# Patient Record
Sex: Female | Born: 2000 | Race: Black or African American | Hispanic: No | Marital: Single | Smoking: Never smoker
Health system: Southern US, Community
[De-identification: ages and names within clinical notes are randomized; demographics above are authoritative.]

## PROBLEM LIST (undated history)

## (undated) DIAGNOSIS — D649 Anemia, unspecified: Secondary | ICD-10-CM

## (undated) HISTORY — PX: NO PAST SURGERIES: SHX2092

## (undated) HISTORY — PX: OTHER SURGICAL HISTORY: SHX169

---
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
## 2021-02-28 ENCOUNTER — Emergency Department
Admission: EM | Admit: 2021-02-28 | Discharge: 2021-02-28 | Disposition: A | Discharge status: Home / Self Care | Payer: Medicaid Other | Attending: Emergency Medicine | Admitting: Emergency Medicine

## 2021-02-28 ENCOUNTER — Other Ambulatory Visit: Payer: Self-pay

## 2021-02-28 DIAGNOSIS — N390 Urinary tract infection, site not specified: Secondary | ICD-10-CM | POA: Insufficient documentation

## 2021-02-28 DIAGNOSIS — M545 Low back pain, unspecified: Secondary | ICD-10-CM | POA: Diagnosis present

## 2021-02-28 DIAGNOSIS — Z8616 Personal history of COVID-19: Secondary | ICD-10-CM | POA: Diagnosis not present

## 2021-02-28 LAB — URINALYSIS, ROUTINE W REFLEX MICROSCOPIC
Bilirubin Urine: NEGATIVE
Glucose, UA: NEGATIVE mg/dL
Hgb urine dipstick: NEGATIVE
Ketones, ur: NEGATIVE mg/dL
Nitrite: NEGATIVE
Protein, ur: NEGATIVE mg/dL
Specific Gravity, Urine: 1.003 — ABNORMAL LOW (ref 1.005–1.030)
pH: 7 (ref 5.0–8.0)

## 2021-02-28 LAB — POC URINE PREG, ED: Preg Test, Ur: NEGATIVE

## 2021-02-28 MED ORDER — CEPHALEXIN 500 MG PO CAPS: 500.0000 mg | ORAL_CAPSULE | Freq: Three times a day (TID) | ORAL | 0 refills | Status: DC

## 2021-02-28 NOTE — Discharge Instructions (Signed)
Follow-up with your primary care provider if any continued problems.  You may use Tylenol or ibuprofen as needed for pain.  Increase fluids to stay hydrated as you have a urinary tract infection.  Take all of the antibiotics that was sent to your pharmacy.

## 2021-02-28 NOTE — ED Provider Notes (Signed)
[facility] Emergency Department Provider Note   ____________________________________________   Event Date/Time   First MD Initiated Contact with Patient 02/28/21 1021     (approximate)  I have reviewed the triage vital signs and the nursing notes.   HISTORY  Chief Complaint Back Pain    HPI Kristen Pittman is a 20 y.o. female presents to the ED with complaint of abdominal cramps and low back pain for the past week.  Patient has some nausea but denies any vomiting or diarrhea.  Patient has had irregular periods and states that her last period began on October 14 but was 8 weeks late when he did start.  Currently she is not taking any birth control.  Denies any fever or chills.  Rates her pain as 7 out of 10.         Past Medical History:  Diagnosis Date   Anemia    History of COVID-19 05/10/2019   Diagnosed 04/28/2019   History of depression 05/10/2019   UTI (urinary tract infection) 07/21/2020    Patient Active Problem List   Diagnosis Date Noted   Anxiety and depression 07/24/2020   History of depression 05/10/2019   History of COVID-19 05/10/2019    Past Surgical History:  Procedure Laterality Date   NO PAST SURGERIES     no surgical history      Prior to Admission medications   Medication Sig Start Date End Date Taking? Authorizing Provider  cephALEXin (KEFLEX) 500 MG capsule Take 1 capsule (500 mg total) by mouth 3 (three) times daily. 02/28/21  Yes Bridget Hartshorn L, PA-C  Norgestimate-Ethinyl Estradiol Triphasic (TRI-SPRINTEC) 0.18/0.215/0.25 MG-35 MCG tablet Take 1 tablet by mouth daily. 07/24/20   Lawhorn, Vanessa [place], CNM  ferrous sulfate 324 MG TBEC Take 324 mg by mouth. Patient not taking: No sig reported  03/24/20  [provider]  sertraline (ZOLOFT) 50 MG tablet Take 1 tablet (50 mg total) by mouth daily. Patient not taking: No sig reported 10/08/19 03/24/20  Gunnar Bulla, CNM     Allergies Lemon oil and Vaccinium angustifolium  Family History  Problem Relation Age of Onset   Migraines Mother    Seizures Mother    Schizophrenia Father    Diabetes Maternal Grandmother    Heart disease Maternal Grandmother    Hypertension Maternal Grandmother    Bone cancer Maternal Grandmother    Colon cancer Maternal Grandfather    Breast cancer Paternal Grandmother     Social History Social History   Tobacco Use   Smoking status: Never   Smokeless tobacco: Never  Vaping Use   Vaping Use: Never used  Substance Use Topics   Alcohol use: No   Drug use: No    Review of Systems Constitutional: No fever/chills Eyes: No visual changes. ENT: No sore throat. Cardiovascular: Denies chest pain. Respiratory: Denies shortness of breath. Gastrointestinal: Mild abdominal cramping.  Positive nausea, no vomiting.  No diarrhea.  No constipation. Genitourinary: Negative for dysuria. Musculoskeletal: Positive for low back pain. Skin: Negative for rash. Neurological: Negative for headaches, focal weakness or numbness. ____________________________________________   PHYSICAL EXAM:  VITAL SIGNS: ED Triage Vitals  Enc Vitals Group     BP 02/28/21 0944 116/67     Pulse Rate 02/28/21 0944 97     Resp 02/28/21 0944 18     Temp 02/28/21 0944 98.9 F (37.2 C)     Temp Source 02/28/21 0944 Oral     SpO2 02/28/21 0944 99 %  Weight 02/28/21 0945 162 lb (73.5 kg)     Height 02/28/21 0945 5\' 3"  (1.6 m)     Head Circumference --      Peak Flow --      Pain Score 02/28/21 0945 7     Pain Loc --      Pain Edu? --      Excl. in GC? --     Constitutional: Alert and oriented. Well appearing and in no acute distress. Eyes: Conjunctivae are normal.  Head: Atraumatic. Neck: No stridor.   Cardiovascular: Normal rate, regular rhythm. Grossly normal heart sounds.  Good peripheral circulation. Respiratory: Normal respiratory effort.  No retractions. Lungs  CTAB. Gastrointestinal: Soft and nontender. No distention.No CVA tenderness. Musculoskeletal: There is minimal tenderness on palpation of the lumbar spine.  No tenderness is noted on palpation of the thoracic or cervical spine.  Range of motion without restriction.  Good muscle strength bilaterally.  Normal gait noted. Neurologic:  Normal speech and language. No gross focal neurologic deficits are appreciated. No gait instability. Skin:  Skin is warm, dry and intact. No rash noted. Psychiatric: Mood and affect are normal. Speech and behavior are normal.  ____________________________________________   LABS (all labs ordered are listed, but only abnormal results are displayed)  Labs Reviewed  URINALYSIS, ROUTINE W REFLEX MICROSCOPIC - Abnormal; Notable for the following components:      Result Value   Color, Urine YELLOW (*)    APPearance CLOUDY (*)    Specific Gravity, Urine 1.003 (*)    Leukocytes,Ua MODERATE (*)    Bacteria, UA RARE (*)    All other components within normal limits  POC URINE PREG, ED   ____________________________________________   PROCEDURES  Procedure(s) performed (including Critical Care):  Procedures   ____________________________________________   INITIAL IMPRESSION / ASSESSMENT AND PLAN / ED COURSE  As part of my medical decision making, I reviewed the following data within the electronic MEDICAL RECORD NUMBER Notes from prior ED visits and [place] Controlled Substance Database  20 year old female presents to the ED with complaint of back pain without history of injury.  She also is concerned about possible pregnancy as her period was approximately 8 weeks late before starting her period.  Urinalysis is suspicious for a urinary tract infection and patient's pregnancy test was negative.  She was made aware of both.  A prescription for Keflex 500 mg 3 times daily for 10 days was sent to the pharmacy.  She is to follow-up with her PCP if any continued problems.  She  is encouraged to drink lots of fluids also.  ____________________________________________   FINAL CLINICAL IMPRESSION(S) / ED DIAGNOSES  Final diagnoses:  Urinary tract infection without hematuria, site unspecified     ED Discharge Orders          Ordered    cephALEXin (KEFLEX) 500 MG capsule  3 times daily        02/28/21 1224             Note:  This document was prepared using Dragon voice recognition software and may include unintentional dictation errors.    13/12/22, PA-C 02/28/21 1543    13/12/22, MD 03/01/21 [phone]

## 2021-02-28 NOTE — ED Triage Notes (Signed)
Pt states that she has been having back pain and pelvic cramps for the past week- pt does have some nausea, but denies vomiting and diarrhea- pt has irregular periods and states that her LMP was October 14th- denies urinary symptoms

## 2021-05-23 ENCOUNTER — Encounter: Payer: Self-pay | Admitting: Nurse Practitioner

## 2021-05-23 ENCOUNTER — Telehealth: Payer: Medicaid Other | Admitting: Nurse Practitioner

## 2021-05-23 DIAGNOSIS — N926 Irregular menstruation, unspecified: Secondary | ICD-10-CM | POA: Diagnosis not present

## 2021-05-23 NOTE — Progress Notes (Signed)
Virtual Visit Consent   Kristen Pittman, you are scheduled for a virtual visit with Kristen Daphine Deutscher, FNP, a [facility] provider, today.     Just as with appointments in the office, your consent must be obtained to participate.  Your consent will be active for this visit and any virtual visit you may have with one of our providers in the next 365 days.     If you have a MyChart account, a copy of this consent can be sent to you electronically.  All virtual visits are billed to your insurance company just like a traditional visit in the office.    As this is a virtual visit, video technology does not allow for your provider to perform a traditional examination.  This may limit your provider's ability to fully assess your condition.  If your provider identifies any concerns that need to be evaluated in person or the need to arrange testing (such as labs, EKG, etc.), we will make arrangements to do so.     Although advances in technology are sophisticated, we cannot ensure that it will always work on either your end or our end.  If the connection with a video visit is poor, the visit may have to be switched to a telephone visit.  With either a video or telephone visit, we are not always able to ensure that we have a secure connection.     I need to obtain your verbal consent now.   Are you willing to proceed with your visit today? YES   [name] has provided verbal consent on 05/23/2021 for a virtual visit (video or telephone).   Kristen Daphine Deutscher, FNP   Date: 05/23/2021 9:02 AM   Virtual Visit via Video Note   I, Kristen Brittnye Josephs, connected with Kristen Pittman ([id number], 09-11-2000) on 05/23/21 at  9:00 AM EST by a video-enabled telemedicine application and verified that I am speaking with the correct person using two identifiers.  Location: Patient: Virtual Visit Location Patient: Home Provider: Virtual Visit Location Provider: Mobile   I  discussed the limitations of evaluation and management by telemedicine and the availability of in person appointments. The patient expressed understanding and agreed to proceed.    History of Present Illness: Kristen Pittman is a 21 y.o. who identifies as a female who was assigned female at birth, and is being seen today for late menses.  HPI: Patient states she had a late period. Her last menses was 03/23/21. She has some nausea and vomiting yesterday. Along with a headache. She did a home pregnancy test and had "super light" positive line.    Review of Systems  Constitutional:  Negative for chills and fever.  HENT: Negative.    Respiratory: Negative.    Cardiovascular: Negative.   Genitourinary: Negative.   Skin: Negative.   Neurological: Negative.   Psychiatric/Behavioral: Negative.    All other systems reviewed and are negative.  Problems:  Patient Active Problem List   Diagnosis Date Noted   Anxiety and depression 07/24/2020   History of depression 05/10/2019   History of COVID-19 05/10/2019    Allergies:  Allergies  Allergen Reactions   Lemon Oil Swelling    Throat itching and entire body itchy   Vaccinium Angustifolium Swelling and Rash    blueberries   Medications:  Current Outpatient Medications:    cephALEXin (KEFLEX) 500 MG capsule, Take 1 capsule (500 mg total) by mouth 3 (three) times daily., Disp: 30 capsule, Rfl: 0   Norgestimate-Ethinyl Estradiol Triphasic (  TRI-SPRINTEC) 0.18/0.215/0.25 MG-35 MCG tablet, Take 1 tablet by mouth daily., Disp: 28 tablet, Rfl: 11  Observations/Objective: Patient is well-developed, well-nourished in no acute distress.  Resting comfortably  at home.  Head is normocephalic, atraumatic.  No labored breathing.  Speech is clear and coherent with logical content.  Patient is alert and oriented at baseline.    Assessment and Plan: Kristen Pittman in today with chief complaint of late menses   1. Late menses- home  pregnancy test positive Needs to see OBASAP Avoid greasy fatty or spicy foods.      Follow Up Instructions: I discussed the assessment and treatment plan with the patient. The patient was provided an opportunity to ask questions and all were answered. The patient agreed with the plan and demonstrated an understanding of the instructions.  A copy of instructions were sent to the patient via MyChart.  The patient was advised to call back or seek an in-person evaluation if the symptoms worsen or if the condition fails to improve as anticipated.  Time:  I spent 9 minutes with the patient via telehealth technology discussing the above problems/concerns.    Kristen Daphine Deutscher, FNP

## 2021-06-16 NOTE — Progress Notes (Signed)
? ? ?  GYNECOLOGY PROGRESS NOTE ? ?Subjective:  ? ? Patient ID: Kristen Pittman, female    DOB: 2000-11-29, 21 y.o.   MRN: [id number] ? ?HPI ? Patient is a 21 y.o. G40P1001 female who presents for complaints of irregular cycles since the end of last year. Had been occurring every 6 weeks or so, but now is even more irregular.   Is currently also experiencing heavier cycles with clotting, vomiting, headaches and nausea. Also very uncomfortable to have intercourse. Is currently on combined OCPs (Tri-Sprintec), has been on this for ~ 6 months. Transitioned from Depo Provera prior to this. Does complain of increase in discharge, no change in color and denies odor. Also notes something on her vagina that feels like a cut for the past few days. Does admit to shaving recently.  ? ?The following portions of the patient's history were reviewed and updated as appropriate: allergies, current medications, past family history, past medical history, past social history, past surgical history, and problem list. ? ?Review of Systems ?Pertinent items noted in HPI and remainder of comprehensive ROS otherwise negative.  ? ?Objective:  ? Blood pressure 109/66, pulse 76, resp. rate 16, height 5' 3.25" (1.607 m), weight 154 lb 4.8 oz (70 kg), last menstrual period 05/21/2021, not currently breastfeeding.  Body mass index is 27.12 kg/m?. ?General appearance: alert and no distress ?Abdomen: soft, non-tender; bowel sounds normal; no masses,  no organomegaly ?Pelvic: external genitalia with small yellow crusting lesion on right labia minora, rectovaginal septum normal.  Vagina with moderate thin white discharge.  Cervix normal appearing, no lesions and no motion tenderness.  Uterus mobile, nontender, normal shape and size.  Adnexae non-palpable, nontender bilaterally.   ?Extremities: extremities normal, atraumatic, no cyanosis or edema ?Neurologic: Grossly normal ? ? ?Assessment:  ? ?1. Breakthrough bleeding on OCPs   ?2. History of  irregular menstrual cycles   ?3. Vaginal lesion   ?4. Vaginal discharge   ?5. Dyspareunia, female   ?  ? ?Plan:  ? ?Discussion had on reasons for her breakthrough bleeding on her OCPs and other PMS symptoms including use of triphasic pills.  Discussed option of switching to a monophasic pill.  Patient okay to try.  Also discussed other birth control options however patient would like to stick with the pills.  Prescribed Ortho-Cyclen.  This will also hopefully help in her history of irregular cycles. ?Vaginal lesion questionable HSV initial outbreak versus healing from recent tear after shaving.  I discussed options with patient including HSV testing, and empiric treatment until cultures return.  Patient okay with this plan.  Will prescribe Valtrex. ?Vaginal discharge increased per patient, NuSwab performed today.  Cannot rule out infection as a cause of discharge and pelvic pain. ?Review, unclear cause.  Patient notes that this is only been ongoing for the past several months.  We will screen for pelvic infections.  If negative and pain persists despite changing of OCPs, may need to consider other alternative diagnoses such as endometriosis. ? ?Return in about 3 months (around 09/17/2021), or if symptoms worsen or fail to improve. ? ? ?Rubie Maid, MD ?[facility] ? ? ?

## 2021-06-17 ENCOUNTER — Other Ambulatory Visit (HOSPITAL_COMMUNITY)
Admission: RE | Admit: 2021-06-17 | Discharge: 2021-06-17 | Disposition: A | Discharge status: Home / Self Care | Payer: Medicaid Other | Source: Ambulatory Visit | Attending: Obstetrics and Gynecology | Admitting: Obstetrics and Gynecology

## 2021-06-17 ENCOUNTER — Ambulatory Visit (INDEPENDENT_AMBULATORY_CARE_PROVIDER_SITE_OTHER): Payer: Medicaid Other | Admitting: Obstetrics and Gynecology

## 2021-06-17 ENCOUNTER — Other Ambulatory Visit: Payer: Self-pay

## 2021-06-17 ENCOUNTER — Encounter: Payer: Self-pay | Admitting: Obstetrics and Gynecology

## 2021-06-17 VITALS — BP 109/66 | HR 76 | Resp 16 | Ht 63.25 in | Wt 154.3 lb

## 2021-06-17 DIAGNOSIS — Z8742 Personal history of other diseases of the female genital tract: Secondary | ICD-10-CM

## 2021-06-17 DIAGNOSIS — N921 Excessive and frequent menstruation with irregular cycle: Secondary | ICD-10-CM | POA: Diagnosis not present

## 2021-06-17 DIAGNOSIS — N898 Other specified noninflammatory disorders of vagina: Secondary | ICD-10-CM | POA: Diagnosis not present

## 2021-06-17 DIAGNOSIS — N941 Unspecified dyspareunia: Secondary | ICD-10-CM

## 2021-06-17 LAB — POCT URINE PREGNANCY: Preg Test, Ur: NEGATIVE

## 2021-06-17 MED ORDER — NORGESTIMATE-ETH ESTRADIOL 0.25-35 MG-MCG PO TABS: 1.0000 | ORAL_TABLET | Freq: Every day | ORAL | 3 refills | Status: DC

## 2021-06-17 MED ORDER — VALACYCLOVIR HCL 1 G PO TABS: 1000.0000 mg | ORAL_TABLET | Freq: Every day | ORAL | 2 refills | Status: DC

## 2021-06-17 NOTE — Patient Instructions (Signed)
Abnormal Uterine Bleeding ?Abnormal uterine bleeding means bleeding more than normal from your womb (uterus). It can include: ?Bleeding after sex. ?Bleeding between monthly (menstrual) periods. ?Bleeding that is heavier than normal. ?Monthly periods that last longer than normal. ?Bleeding after you have stopped having your monthly period (menopause). ?You should see a doctor for any kind of bleeding that is not normal. Treatment depends on the cause of your bleeding and how much you bleed. ?Follow these instructions at home: ?Medicines ?Take over-the-counter and prescription medicines only as told by your doctor. ?Ask your doctor about: ?Taking medicines such as aspirin and ibuprofen. Do not take these medicines unless your doctor tells you to take them. ?Taking over-the-counter medicines, vitamins, herbs, and supplements. ?You may be given iron pills. Take them as told by your doctor. ?Managing constipation ?If you take iron pills, you may need to take these actions to prevent or treat trouble pooping (constipation): ?Drink enough fluid to keep your pee (urine) pale yellow. ?Take over-the-counter or prescription medicines. ?Eat foods that are high in fiber. These include beans, whole grains, and fresh fruits and vegetables. ?Limit foods that are high in fat and sugar. These include fried or sweet foods. ?Activity ?Change your activity to decrease bleeding if you need to change your sanitary pad more than one time every 2 hours: ?Lie in bed with your feet raised (elevated). ?Place a cold pack on your lower belly. ?Rest as much as you are able until the bleeding stops or slows down. ?General instructions ?Do not use tampons, douche, or have sex until your doctor says these things are okay. ?Change your pads often. ?Get regular exams. These include: ?Pelvic exams. ?Screenings for cancer of the cervix. ?It is up to you to get the results of any tests that are done. Ask how to get your results when they are  ready. ?Watch for any changes in your bleeding. For 2 months, write down: ?When your monthly period starts. ?When your monthly period ends. ?When you get any abnormal bleeding from your vagina. ?What problems you notice. ?Keep all follow-up visits. ?Contact a doctor if: ?The bleeding lasts more than one week. ?You feel dizzy at times. ?You feel like you may vomit (nausea). ?You vomit. ?You feel light-headed or weak. ?Your symptoms get worse. ?Get help right away if: ?You faint. ?You have to change pads every hour. ?You have pain in your belly. ?You have a fever or chills. ?You get sweaty or weak. ?You pass large blood clots from your vagina. ?These symptoms may be an emergency. Get help right away. Call your local emergency services (911 in the [place] ?Do not wait to see if the symptoms will go away. ?Do not drive yourself to the hospital. ?Summary ?Abnormal uterine bleeding means bleeding more than normal from your womb (uterus). ?Any kind of bleeding that is not normal should be checked by a doctor. ?Treatment depends on the cause of your bleeding and how much you bleed. ?Get help right away if you faint, you have to change pads every hour, or you pass large blood clots from your vagina. ?This information is not intended to replace advice given to you by your health care provider. Make sure you discuss any questions you have with your health care provider. ?Document Revised: 08/05/2020 Document Reviewed: 08/05/2020 ?Elsevier Patient Education ? 2022 Elsevier Inc[employer] ? ?

## 2021-06-18 ENCOUNTER — Encounter: Payer: Self-pay | Admitting: Obstetrics and Gynecology

## 2021-06-18 LAB — CERVICOVAGINAL ANCILLARY ONLY
Chlamydia: POSITIVE — AB
Comment: NEGATIVE
Comment: NEGATIVE
Comment: NORMAL
Neisseria Gonorrhea: NEGATIVE
Trichomonas: NEGATIVE

## 2021-06-18 LAB — HSV(HERPES SIMPLEX VRS) I + II AB-IGG
HSV 1 Glycoprotein G Ab, IgG: <0.91 index (ref 0.00–0.90)
HSV 2 IgG, Type Spec: <0.91 index (ref 0.00–0.90)

## 2021-06-18 MED ORDER — AZITHROMYCIN 500 MG PO TABS: 1000.0000 mg | ORAL_TABLET | Freq: Once | ORAL | 1 refills | Status: AC

## 2021-06-18 NOTE — Addendum Note (Signed)
Addended by: Augusto Gamble on: 06/18/2021 02:44 PM ? ? Modules accepted: Orders ? ?

## 2021-06-22 ENCOUNTER — Other Ambulatory Visit: Payer: Self-pay

## 2021-06-22 MED ORDER — VALACYCLOVIR HCL 1 G PO TABS: 1000.0000 mg | ORAL_TABLET | Freq: Every day | ORAL | 2 refills | Status: DC

## 2021-06-24 ENCOUNTER — Encounter: Payer: Self-pay | Admitting: Obstetrics and Gynecology

## 2021-07-22 ENCOUNTER — Encounter: Payer: Self-pay | Admitting: Obstetrics and Gynecology

## 2021-07-22 ENCOUNTER — Ambulatory Visit (INDEPENDENT_AMBULATORY_CARE_PROVIDER_SITE_OTHER): Payer: Medicaid Other | Admitting: Obstetrics and Gynecology

## 2021-07-22 ENCOUNTER — Other Ambulatory Visit (HOSPITAL_COMMUNITY)
Admission: RE | Admit: 2021-07-22 | Discharge: 2021-07-22 | Disposition: A | Discharge status: Home / Self Care | Payer: Medicaid Other | Source: Ambulatory Visit | Attending: Obstetrics and Gynecology | Admitting: Obstetrics and Gynecology

## 2021-07-22 VITALS — BP 105/67 | HR 82 | Resp 16 | Ht 63.0 in | Wt 152.7 lb

## 2021-07-22 DIAGNOSIS — Z8619 Personal history of other infectious and parasitic diseases: Secondary | ICD-10-CM | POA: Insufficient documentation

## 2021-07-22 DIAGNOSIS — N920 Excessive and frequent menstruation with regular cycle: Secondary | ICD-10-CM | POA: Diagnosis not present

## 2021-07-22 NOTE — Progress Notes (Signed)
? ? ?  GYNECOLOGY PROGRESS NOTE ? ?Subjective:  ? ? Patient ID: Kristen Pittman, female    DOB: March 01, 2001, 21 y.o.   MRN: [id number] ? ?HPI ? Patient is a 21 y.o. G93P1001 female who presents for a test of cure. She tested positive for Chlamydia on 06/17/2021. She also has some concerns about her cycles, they have become more heavier and she has had a lot of clots with her cycles. ? ?The following portions of the patient's history were reviewed and updated as appropriate: allergies, current medications, past family history, past medical history, past social history, past surgical history, and problem list. ? ?Review of Systems ?Pertinent items noted in HPI and remainder of comprehensive ROS otherwise negative.  ? ?Objective:  ? Blood pressure 105/67, pulse 82, resp. rate 16, height 5\' 3"  (1.6 m), weight 152 lb 11.2 oz (69.3 kg), not currently breastfeeding.  Body mass index is 27.05 kg/m?. ?General appearance: alert and no distress ?Abdomen: soft, non-tender; bowel sounds normal; no masses,  no organomegaly ?Pelvic: deferred. Patient allowed to collect self-swab.  ? ? ? ?Assessment:  ? ?1. History of chlamydia   ?2. Menorrhagia with regular cycle   ?  ?Plan:  ? ?1. History of chlamydia ?- Reports completion of treatment, partner treated as well.  Self swab for TOC done today.  ? ?2. Menorrhagia with regular cycle ?- Patient with h/o more heavy cycles lately, since stopping Depo and transitioning to OCPs. Was supposed to transition from Ortho Tri-Cyclen to Ortho Cyclen after last visit. However patient notes that she did not know her prescription was available.  Will pick up later today and start with next cycle.  ? ? ?Rubie Maid, MD ?[facility] ? ?

## 2021-07-23 LAB — CERVICOVAGINAL ANCILLARY ONLY
Chlamydia: NEGATIVE
Comment: NEGATIVE
Comment: NORMAL
Neisseria Gonorrhea: NEGATIVE

## 2021-11-20 ENCOUNTER — Encounter: Payer: Self-pay | Admitting: Obstetrics and Gynecology

## 2021-11-20 ENCOUNTER — Ambulatory Visit (INDEPENDENT_AMBULATORY_CARE_PROVIDER_SITE_OTHER): Payer: Medicaid Other | Admitting: Obstetrics and Gynecology

## 2021-11-20 VITALS — Ht 63.0 in | Wt 152.0 lb

## 2021-11-20 DIAGNOSIS — Z3042 Encounter for surveillance of injectable contraceptive: Secondary | ICD-10-CM

## 2021-11-20 MED ORDER — MEDROXYPROGESTERONE ACETATE 150 MG/ML IM SUSP
150.0000 mg | Freq: Once | INTRAMUSCULAR | Status: AC
  Administered 2021-11-20: 150 mg via INTRAMUSCULAR

## 2021-11-20 NOTE — Progress Notes (Signed)
Side Effects if any: NONE Depo-Provera 150 mg IM given by: Roddie Mc, CMA Next appointment due OCT 20 - NOV 3. Pt presents for routine depo injection, injection tolerated well, no interactions noted. Pt to f/u in 3 months as scheduled for next injection. Pt verbalized she would like to schedule an appointment to see a provider for an all over checkup (physical) patient advised to schedule this at checkout. All questions answered.

## 2021-12-09 ENCOUNTER — Ambulatory Visit: Payer: Medicaid Other

## 2021-12-31 ENCOUNTER — Ambulatory Visit: Payer: Medicaid Other

## 2022-01-28 ENCOUNTER — Ambulatory Visit
Admission: EM | Admit: 2022-01-28 | Discharge: 2022-01-28 | Disposition: A | Discharge status: Home / Self Care | Payer: Medicaid Other | Attending: Emergency Medicine | Admitting: Emergency Medicine

## 2022-01-28 DIAGNOSIS — R110 Nausea: Secondary | ICD-10-CM | POA: Diagnosis not present

## 2022-01-28 DIAGNOSIS — Z8616 Personal history of COVID-19: Secondary | ICD-10-CM | POA: Insufficient documentation

## 2022-01-28 DIAGNOSIS — R1030 Lower abdominal pain, unspecified: Secondary | ICD-10-CM | POA: Diagnosis present

## 2022-01-28 DIAGNOSIS — Z113 Encounter for screening for infections with a predominantly sexual mode of transmission: Secondary | ICD-10-CM | POA: Insufficient documentation

## 2022-01-28 DIAGNOSIS — N939 Abnormal uterine and vaginal bleeding, unspecified: Secondary | ICD-10-CM | POA: Diagnosis not present

## 2022-01-28 LAB — POCT URINALYSIS DIP (MANUAL ENTRY)
Bilirubin, UA: NEGATIVE
Glucose, UA: NEGATIVE mg/dL
Ketones, POC UA: NEGATIVE mg/dL
Nitrite, UA: NEGATIVE
Protein Ur, POC: NEGATIVE mg/dL
Spec Grav, UA: 1.02 (ref 1.010–1.025)
Urobilinogen, UA: 0.2 E.U./dL
pH, UA: 6.5 (ref 5.0–8.0)

## 2022-01-28 LAB — POCT URINE PREGNANCY: Preg Test, Ur: NEGATIVE

## 2022-01-28 NOTE — ED Provider Notes (Signed)
[name]    CSN: [id number] Arrival date & time: 01/28/22  4332      History   Chief Complaint Chief Complaint  Patient presents with   Abdominal Cramping    HPI Kristen Pittman is a 21 y.o. female.  Patient presents with 2+ month history of follow-up intermittent vaginal bleeding and lower abdominal cramping.  Her symptoms started after she had the Depo shot at her OB/GYN office on 11/19/2021.  Her vaginal bleeding was initially heavy but has slowed down to intermittent spotting.  The abdominal cramping accompanies the bleeding.  She denies fever, chills, dysuria, hematuria, vaginal discharge, pelvic pain, flank pain, or other symptoms.  She also reports intermittent nausea.  No emesis in the past week.    The history is provided by the patient and medical records.    Past Medical History:  Diagnosis Date   Anemia    History of COVID-19 05/10/2019   Diagnosed 04/28/2019   History of depression 05/10/2019   UTI (urinary tract infection) 07/21/2020    Patient Active Problem List   Diagnosis Date Noted   Anxiety and depression 07/24/2020   History of depression 05/10/2019   History of COVID-19 05/10/2019    Past Surgical History:  Procedure Laterality Date   NO PAST SURGERIES     no surgical history      OB History     Gravida  1   Para  1   Term  1   Preterm      AB      Living  1      SAB      IAB      Ectopic      Multiple  0   Live Births  1            Home Medications    Prior to Admission medications   Medication Sig Start Date End Date Taking? Authorizing Provider  norgestimate-ethinyl estradiol (ORTHO-CYCLEN) 0.25-35 MG-MCG tablet Take 1 tablet by mouth daily. 06/17/21   Rubie Maid, MD  valACYclovir (VALTREX) 1000 MG tablet Take 1 tablet (1,000 mg total) by mouth daily. 06/22/21   Rubie Maid, MD  ferrous sulfate 324 MG TBEC Take 324 mg by mouth. Patient not taking: No sig reported  03/24/20  [provider]  sertraline (ZOLOFT) 50 MG tablet Take 1 tablet (50 mg total) by mouth daily. Patient not taking: No sig reported 10/08/19 03/24/20  Diona Fanti, CNM    Family History Family History  Problem Relation Age of Onset   Migraines Mother    Seizures Mother    Schizophrenia Father    Diabetes Maternal Grandmother    Heart disease Maternal Grandmother    Hypertension Maternal Grandmother    Bone cancer Maternal Grandmother    Colon cancer Maternal Grandfather    Breast cancer Paternal Grandmother     Social History Social History   Tobacco Use   Smoking status: Never   Smokeless tobacco: Never  Vaping Use   Vaping Use: Never used  Substance Use Topics   Alcohol use: No   Drug use: No     Allergies   Lemon oil and Vaccinium angustifolium   Review of Systems Review of Systems  Constitutional:  Negative for chills and fever.  Gastrointestinal:  Positive for abdominal pain and nausea. Negative for vomiting.  Genitourinary:  Positive for vaginal bleeding. Negative for dysuria, flank pain, hematuria, pelvic pain and vaginal discharge.  Skin:  Negative for  rash.  All other systems reviewed and are negative.    Physical Exam Triage Vital Signs ED Triage Vitals  Enc Vitals Group     BP      Pulse      Resp      Temp      Temp src      SpO2      Weight      Height      Head Circumference      Peak Flow      Pain Score      Pain Loc      Pain Edu?      Excl. in GC?    No data found.  Updated Vital Signs BP 119/72   Pulse 71   Temp 98.4 F (36.9 C)   Resp 18   Ht 5\' 3"  (1.6 m)   Wt 154 lb (69.9 kg)   SpO2 98%   BMI 27.28 kg/m   Visual Acuity Right Eye Distance:   Left Eye Distance:   Bilateral Distance:    Right Eye Near:   Left Eye Near:    Bilateral Near:     Physical Exam Vitals and nursing note reviewed.  Constitutional:      General: She is not in acute distress.    Appearance: Normal appearance. She is well-developed. She  is not ill-appearing.  HENT:     Mouth/Throat:     Mouth: Mucous membranes are moist.  Cardiovascular:     Rate and Rhythm: Normal rate and regular rhythm.     Heart sounds: Normal heart sounds.  Pulmonary:     Effort: Pulmonary effort is normal. No respiratory distress.     Breath sounds: Normal breath sounds.  Abdominal:     General: Bowel sounds are normal.     Palpations: Abdomen is soft.     Tenderness: There is no abdominal tenderness. There is no left CVA tenderness, guarding or rebound.  Musculoskeletal:     Cervical back: Neck supple.  Skin:    General: Skin is warm and dry.  Neurological:     Mental Status: She is alert.  Psychiatric:        Mood and Affect: Mood normal.        Behavior: Behavior normal.      UC Treatments / Results  Labs (all labs ordered are listed, but only abnormal results are displayed) Labs Reviewed  POCT URINALYSIS DIP (MANUAL ENTRY) - Abnormal; Notable for the following components:      Result Value   Blood, UA moderate (*)    Leukocytes, UA Trace (*)    All other components within normal limits  URINE CULTURE  POCT URINE PREGNANCY  CERVICOVAGINAL ANCILLARY ONLY    EKG   Radiology No results found.  Procedures Procedures (including critical care time)  Medications Ordered in UC Medications - No data to display  Initial Impression / Assessment and Plan / UC Course  I have reviewed the triage vital signs and the nursing notes.  Pertinent labs & imaging results that were available during my care of the patient were reviewed by me and considered in my medical decision making (see chart for details).    Lower abdominal pain, abnormal vaginal bleeding.  Urine pregnancy negative.  Urine culture pending.  Patient obtained vaginal self swab for testing.  Discussed that we will call if test results are positive.  Discussed that she may require treatment at that time.  Instructed her to follow-up  with her gynecologist.  Patient agrees  to plan of care.   Final Clinical Impressions(s) / UC Diagnoses   Final diagnoses:  Lower abdominal pain  Abnormal vaginal bleeding     Discharge Instructions      The urine culture is pending.  We will call you if it shows the need for treatment.    Your vaginal tests are pending.  If your test results are positive, we will call you.    Follow up with your gynecologist.            ED Prescriptions   None    PDMP not reviewed this encounter.   Mickie Bail, NP 01/28/22 1023

## 2022-01-28 NOTE — Discharge Instructions (Addendum)
The urine culture is pending.  We will call you if it shows the need for treatment.    Your vaginal tests are pending.  If your test results are positive, we will call you.    Follow up with your gynecologist.

## 2022-01-28 NOTE — ED Triage Notes (Signed)
Patient to Urgent Care with complaints of vaginal spotting and cramping x1 month. Reports pain is intermittent. Also reports some emesis at random times of the day. States that she recently placed back on the depo shot after taking an 8 month break. Previously used the depo shot for four years without any issue.  LMP July- reports that she started feeling sick around this time.

## 2022-01-29 ENCOUNTER — Telehealth (HOSPITAL_COMMUNITY): Payer: Self-pay | Admitting: Emergency Medicine

## 2022-01-29 LAB — CERVICOVAGINAL ANCILLARY ONLY
Bacterial Vaginitis (gardnerella): POSITIVE — AB
Candida Glabrata: NEGATIVE
Candida Vaginitis: NEGATIVE
Chlamydia: NEGATIVE
Comment: NEGATIVE
Comment: NEGATIVE
Comment: NEGATIVE
Comment: NEGATIVE
Comment: NEGATIVE
Comment: NORMAL
Neisseria Gonorrhea: NEGATIVE
Trichomonas: NEGATIVE

## 2022-01-29 LAB — URINE CULTURE

## 2022-01-29 MED ORDER — METRONIDAZOLE 500 MG PO TABS: 500.0000 mg | ORAL_TABLET | Freq: Two times a day (BID) | ORAL | 0 refills | Status: DC

## 2022-02-08 NOTE — Progress Notes (Signed)
    GYNECOLOGY PROGRESS NOTE  Subjective:    Patient ID: Kristen Pittman, female    DOB: 06/27/2000, 21 y.o.   MRN: [id number]  HPI  Patient is a 21 y.o. G71P1001 female who presents for complaints of breakthrough bleeding on Depo Provera. Patient was changed from OCPs to Depo Provera in August due to breakthrough bleeding.  Still noting issues. Notes bleeding is irregular, varies in flow from light to heavy.  Also notes that with onset of her bleeding or periods she begins to feel sick with nausea and vomiting, loss of appetite. Has associated abdominal cramping with bleeding.    Of note, patient was seen in 2 weeks ago as nurse visit for STD screen as she thought this might be the cause of some of her symptoms (has past h/o Chlamydia treated in August), and was negative for infection, UTI, or pregnancy.    The following portions of the patient's history were reviewed and updated as appropriate: allergies, current medications, past family history, past medical history, past social history, past surgical history, and problem list.  Review of Systems Pertinent items noted in HPI and remainder of comprehensive ROS otherwise negative.   Objective:   Blood pressure 106/70, pulse 68, resp. rate 16, height 5\' 3"  (1.6 m), weight 156 lb 8 oz (71 kg). Body mass index is 27.72 kg/m. General appearance: alert and no distress Remainder of exam deferred.    Assessment:   1. Breakthrough bleeding on Depo-Provera   2. Encounter for surveillance of injectable contraceptive      Plan:   - Discussed that breakthrough bleeding can occur on Depo Provera (however also had issues with OCPs as well). Discussed options of starting next dose of Depo Provera (as she would be due in 1.5 weeks, as well as supplementing with hormonal OCPs to suppress bleeding.  Patient ok to try both. Depo injection given today. Given samples of Nextellis to take for 6 weeks continuously. May also help to suppress other PMS  symptoms (cramping, nausea/vomiting, headaches) that patient is experiencing.    Rubie Maid, MD [place]

## 2022-02-09 ENCOUNTER — Ambulatory Visit (INDEPENDENT_AMBULATORY_CARE_PROVIDER_SITE_OTHER): Payer: Medicaid Other | Admitting: Obstetrics and Gynecology

## 2022-02-09 ENCOUNTER — Encounter: Payer: Self-pay | Admitting: Obstetrics and Gynecology

## 2022-02-09 VITALS — BP 106/70 | HR 68 | Resp 16 | Ht 63.0 in | Wt 156.5 lb

## 2022-02-09 DIAGNOSIS — Z3042 Encounter for surveillance of injectable contraceptive: Secondary | ICD-10-CM | POA: Diagnosis not present

## 2022-02-09 DIAGNOSIS — N921 Excessive and frequent menstruation with irregular cycle: Secondary | ICD-10-CM | POA: Diagnosis not present

## 2022-02-09 MED ORDER — MEDROXYPROGESTERONE ACETATE 150 MG/ML IM SUSP
150.0000 mg | Freq: Once | INTRAMUSCULAR | Status: AC
  Administered 2022-02-09: 150 mg via INTRAMUSCULAR

## 2022-02-09 NOTE — Patient Instructions (Signed)
Medroxyprogesterone Injection (Contraception) ?What is this medication? ?MEDROXYPROGESTERONE (me DROX ee proe JES te rone) prevents ovulation and pregnancy. It belongs to a group of medications called contraceptives. This medication is a progestin hormone. ?This medicine may be used for other purposes; ask your health care provider or pharmacist if you have questions. ?COMMON BRAND NAME(S): Depo-Provera, Depo-subQ Provera 104 ?What should I tell my care team before I take this medication? ?They need to know if you have any of these conditions: ?Asthma ?Blood clots ?Breast cancer or family history of breast cancer ?Depression ?Diabetes ?Eating disorder (anorexia nervosa) ?Heart attack ?High blood pressure ?HIV infection or AIDS ?If you often drink alcohol ?Kidney disease ?Liver disease ?Migraine headaches ?Osteoporosis, weak bones ?Seizures ?Stroke ?Tobacco smoker ?Vaginal bleeding ?An unusual or allergic reaction to medroxyprogesterone, other hormones, medications, foods, dyes, or preservatives ?Pregnant or trying to get pregnant ?Breast-feeding ?How should I use this medication? ?Depo-Provera CI contraceptive injection is given into a muscle. Depo-subQ Provera 104 injection is given under the skin. It is given in a hospital or clinic setting. The injection is usually given during the first 5 days after the start of a menstrual period or 6 weeks after delivery of a baby. ?A patient package insert for the product will be given with each prescription and refill. Be sure to read this information carefully each time. The sheet may change often. ?Talk to your care team about the use of this medication in children. Special care may be needed. These injections have been used in female children who have started having menstrual periods. ?Overdosage: If you think you have taken too much of this medicine contact a poison control center or emergency room at once. ?NOTE: This medicine is only for you. Do not share this medicine  with others. ?What if I miss a dose? ?Keep appointments for follow-up doses. You must get an injection once every 3 months. It is important not to miss your dose. Call your care team if you are unable to keep an appointment. ?What may interact with this medication? ?Antibiotics or medications for infections, especially rifampin and griseofulvin ?Antivirals for HIV or hepatitis ?Aprepitant ?Armodafinil ?Bexarotene ?Bosentan ?Medications for seizures like carbamazepine, felbamate, oxcarbazepine, phenytoin, phenobarbital, primidone, topiramate ?Mitotane ?Modafinil ?St. John's wort ?This list may not describe all possible interactions. Give your health care provider a list of all the medicines, herbs, non-prescription drugs, or dietary supplements you use. Also tell them if you smoke, drink alcohol, or use illegal drugs. Some items may interact with your medicine. ?What should I watch for while using this medication? ?This medication does not protect you against HIV infection (AIDS) or other sexually transmitted diseases. ?Use of this product may cause you to lose calcium from your bones. Loss of calcium may cause weak bones (osteoporosis). Only use this product for more than 2 years if other forms of birth control are not right for you. The longer you use this product for birth control the more likely you will be at risk for weak bones. Ask your care team how you can keep strong bones. ?You may have a change in bleeding pattern or irregular periods. Many females stop having periods while taking this medication. ?If you have received your injections on time, your chance of being pregnant is very low. If you think you may be pregnant, see your care team as soon as possible. ?Tell your care team if you want to get pregnant within the next year. The effect of this medication may last a   long time after you get your last injection. ?What side effects may I notice from receiving this medication? ?Side effects that you should  report to your care team as soon as possible: ?Allergic reactions--skin rash, itching, hives, swelling of the face, lips, tongue, or throat ?Blood clot--pain, swelling, or warmth in the leg, shortness of breath, chest pain ?Gallbladder problems--severe stomach pain, nausea, vomiting, fever ?Increase in blood pressure ?Liver injury--right upper belly pain, loss of appetite, nausea, light-colored stool, dark yellow or brown urine, yellowing skin or eyes, unusual weakness or fatigue ?New or worsening migraines or headaches ?Seizures ?Stroke--sudden numbness or weakness of the face, arm, or leg, trouble speaking, confusion, trouble walking, loss of balance or coordination, dizziness, severe headache, change in vision ?Unusual vaginal discharge, itching, or odor ?Worsening mood, feelings of depression ?Side effects that usually do not require medical attention (report to your care team if they continue or are bothersome): ?Breast pain or tenderness ?Dark patches of the skin on the face or other sun-exposed areas ?Irregular menstrual cycles or spotting ?Nausea ?Weight gain ?This list may not describe all possible side effects. Call your doctor for medical advice about side effects. You may report side effects to FDA at 1-800-FDA-1088. ?Where should I keep my medication? ?This injection is only given by a care team. It will not be stored at home. ?NOTE: This sheet is a summary. It may not cover all possible information. If you have questions about this medicine, talk to your doctor, pharmacist, or health care provider. ?? 2023 Elsevier/Gold Standard (2020-06-08 00:00:00) ? ?

## 2022-02-09 NOTE — Progress Notes (Signed)
Date last pap: Not age appropriate. Last Depo-Provera: 11/20/2021. Side Effects if any: None. Serum HCG indicated? N/A. Depo-Provera 150 mg IM given by: Cristy Folks, CMA. Next appointment due: Jan. 9 - Jan23

## 2022-04-26 ENCOUNTER — Encounter: Payer: Self-pay | Admitting: Obstetrics and Gynecology

## 2022-04-27 ENCOUNTER — Ambulatory Visit: Payer: Medicaid Other

## 2022-04-27 ENCOUNTER — Encounter: Payer: Self-pay | Admitting: Obstetrics and Gynecology

## 2022-05-06 ENCOUNTER — Encounter: Payer: Self-pay | Admitting: Obstetrics and Gynecology

## 2022-05-06 ENCOUNTER — Ambulatory Visit (INDEPENDENT_AMBULATORY_CARE_PROVIDER_SITE_OTHER): Payer: Medicaid Other

## 2022-05-06 VITALS — BP 120/60 | Ht 63.0 in | Wt 174.0 lb

## 2022-05-06 DIAGNOSIS — Z3202 Encounter for pregnancy test, result negative: Secondary | ICD-10-CM

## 2022-05-06 DIAGNOSIS — N912 Amenorrhea, unspecified: Secondary | ICD-10-CM

## 2022-05-06 LAB — POCT URINE PREGNANCY: Preg Test, Ur: NEGATIVE

## 2022-05-06 NOTE — Progress Notes (Signed)
    NURSE VISIT NOTE  Subjective:    Patient ID: Lisl McCord-Patterson, female    DOB: 2000/09/07, 22 y.o.   MRN: [id number]  HPI  Patient is a 22 y.o. G51P1001 female who presents for evaluation of amenorrhea. She believes she could be pregnant. . Current symptoms also include: positive home pregnancy test. Last period was normal.    Objective:    BP 120/60 (BP Location: Left Arm, Patient Position: Sitting, Cuff Size: Normal)   Ht 5\' 3"  (1.6 m)   Wt 174 lb (78.9 kg)   LMP 02/12/2022 (Approximate) Comment: has stopped depo; plans to restart end of this month.  BMI 30.82 kg/m   Lab Review  Results for orders placed or performed in visit on 05/06/22  POCT urine pregnancy  Result Value Ref Range   Preg Test, Ur Negative Negative    Assessment:   1. Amenorrhea     Plan:      Pregnancy Test: @PLANEND @Negative   Pt is scheduled for annual on the 23rd; adv to have preg test rechecked at that appt.     Cleophas Dunker, CMA

## 2022-05-07 NOTE — Progress Notes (Signed)
    GYNECOLOGY PROGRESS NOTE  Subjective:    Patient ID: Kristen Pittman, female    DOB: Feb 25, 2001, 22 y.o.   MRN: [id number]  HPI  Patient is a 22 y.o. G61P1001 female who presents for abdominal cramping. She has been having abdominal pain and nausea x 2 weeks. She describes the pain as cramping and throbbing. She has been taking Aleve to relieve with some relief. She is currently on the Depo Provera injection for contraception. She started having some breakthrough bleeding on 01/04 and continues to have some light bleeding today. She had a pregnancy test done 1-2 weeks ago and it was negative. Patient reports that is sexually active, however has not been recently due to the bleeding.   The following portions of the patient's history were reviewed and updated as appropriate: allergies, current medications, past family history, past medical history, past social history, past surgical history, and problem list.   Review of Systems Pertinent items noted in HPI and remainder of comprehensive ROS otherwise negative.   Objective:   Blood pressure 114/69, pulse (!) 109, resp. rate 16, height 5\' 3"  (1.6 m), weight 177 lb 14.4 oz (80.7 kg), last menstrual period 02/12/2022. Body mass index is 31.51 kg/m.  General appearance: alert and no distress Abdomen:  soft, mildly tender to palpation in lower abdomen (generalized) Pelvic: external genitalia normal, rectovaginal septum normal.  Vagina with small amount of brown-yellow dishcarge.  Cervix normal appearing, no lesions but moderate tenderness to Q-tip during vaginal swab. Uterus mobile, nontender, normal shape and size.  Adnexae non-palpable, tenderness noted on left>right..  Extremities: extremities normal, atraumatic, no cyanosis or edema Neurologic: Grossly normal   Labs:  Results for orders placed or performed in visit on 05/11/22  POCT urine pregnancy  Result Value Ref Range   Preg Test, Ur Negative Negative     Assessment:    1. Pelvic pain   2. Breakthrough bleeding on depo provera     Plan:   Pelvic pain - unclear cause.  Patient has noted pain over the past few months.  Had STI and vaginitis screening performed in October, was positive for BV which she was treated for. Fairly significant cervical tenderness noted on today's exam. Re-screening for STIs performed today. Also will order pelvic ultrasound to assess for pelvic masses as cause of pain.  Breakthrough bleeding on Depo Provera - patient with prior h/o breakthrough bleeding with OCPs, switched to Depo Provera ~ 3 months ago. Now noting bleeding again.  UPT performed negative today. Given sample of OCPs (Nextellis) x 1 month to help with bleeding.   3. Will determine further management based on results.    Rubie Maid, MD [place]

## 2022-05-11 ENCOUNTER — Ambulatory Visit (INDEPENDENT_AMBULATORY_CARE_PROVIDER_SITE_OTHER): Payer: Medicaid Other | Admitting: Obstetrics and Gynecology

## 2022-05-11 ENCOUNTER — Other Ambulatory Visit (HOSPITAL_COMMUNITY)
Admission: RE | Admit: 2022-05-11 | Discharge: 2022-05-11 | Disposition: A | Discharge status: Home / Self Care | Payer: Medicaid Other | Source: Ambulatory Visit | Attending: Obstetrics and Gynecology | Admitting: Obstetrics and Gynecology

## 2022-05-11 ENCOUNTER — Encounter: Payer: Self-pay | Admitting: Obstetrics and Gynecology

## 2022-05-11 VITALS — BP 114/69 | HR 109 | Resp 16 | Ht 63.0 in | Wt 177.9 lb

## 2022-05-11 DIAGNOSIS — Z3202 Encounter for pregnancy test, result negative: Secondary | ICD-10-CM | POA: Diagnosis not present

## 2022-05-11 DIAGNOSIS — N921 Excessive and frequent menstruation with irregular cycle: Secondary | ICD-10-CM | POA: Diagnosis not present

## 2022-05-11 DIAGNOSIS — R102 Pelvic and perineal pain: Secondary | ICD-10-CM | POA: Diagnosis not present

## 2022-05-11 LAB — POCT URINE PREGNANCY: Preg Test, Ur: NEGATIVE

## 2022-05-11 MED ORDER — NEXTSTELLIS 3-14.2 MG PO TABS: 1.0000 | ORAL_TABLET | Freq: Every day | ORAL | 0 refills | Status: AC

## 2022-05-13 ENCOUNTER — Encounter: Payer: Self-pay | Admitting: Obstetrics and Gynecology

## 2022-05-13 ENCOUNTER — Ambulatory Visit
Admission: RE | Admit: 2022-05-13 | Discharge: 2022-05-13 | Disposition: A | Discharge status: Home / Self Care | Payer: Medicaid Other | Source: Ambulatory Visit | Attending: Obstetrics and Gynecology | Admitting: Obstetrics and Gynecology

## 2022-05-13 ENCOUNTER — Other Ambulatory Visit: Payer: Self-pay | Admitting: Obstetrics and Gynecology

## 2022-05-13 ENCOUNTER — Other Ambulatory Visit: Payer: Self-pay

## 2022-05-13 DIAGNOSIS — R102 Pelvic and perineal pain unspecified side: Secondary | ICD-10-CM

## 2022-05-13 LAB — CERVICOVAGINAL ANCILLARY ONLY
Bacterial Vaginitis (gardnerella): POSITIVE — AB
Candida Glabrata: NEGATIVE
Candida Vaginitis: NEGATIVE
Chlamydia: NEGATIVE
Comment: NEGATIVE
Comment: NEGATIVE
Comment: NEGATIVE
Comment: NEGATIVE
Comment: NEGATIVE
Comment: NORMAL
Neisseria Gonorrhea: NEGATIVE
Trichomonas: NEGATIVE

## 2022-05-13 MED ORDER — METRONIDAZOLE 0.75 % VA GEL: 1.0000 | Freq: Every day | VAGINAL | 5 refills | Status: DC

## 2022-05-13 MED ORDER — METRONIDAZOLE 0.75 % VA GEL: 1.0000 | Freq: Every day | VAGINAL | 5 refills | Status: AC

## 2022-06-07 ENCOUNTER — Encounter: Payer: Self-pay | Admitting: Obstetrics and Gynecology

## 2022-06-29 ENCOUNTER — Ambulatory Visit
Admission: RE | Admit: 2022-06-29 | Discharge: 2022-06-29 | Disposition: A | Discharge status: Home / Self Care | Payer: Medicaid Other | Source: Ambulatory Visit | Attending: Urgent Care | Admitting: Urgent Care

## 2022-06-29 VITALS — BP 109/68 | HR 79 | Temp 98.2°F | Resp 16

## 2022-06-29 DIAGNOSIS — S63621A Sprain of interphalangeal joint of right thumb, initial encounter: Secondary | ICD-10-CM | POA: Diagnosis not present

## 2022-06-29 NOTE — Discharge Instructions (Addendum)
Wear thumb brace to stabilize and for comfort, especially at night while sleeping. Continue to use cold therapy and use NSAID medication such as ibuprofen or naproxen to relieve pain.  Go to orthopedic urgent care if your symptoms dont improve significantly after 2 weeks.

## 2022-06-29 NOTE — ED Provider Notes (Signed)
Kristen Pittman    CSN: [id number] Arrival date & time: 06/29/22  1028      History   Chief Complaint Chief Complaint  Patient presents with   Wrist Pain    Possibly strained it on right thumb. - Entered by patient    HPI Kristen Pittman is a 22 y.o. female.   HPI  Presents to urgent care with complaint of right thumb and wrist pain.  She reports straining right wrist while moving her son's crib.  Injury occurring yesterday.  Patient has been applying ice and reports improved swelling.  Past Medical History:  Diagnosis Date   Anemia    History of COVID-19 05/10/2019   Diagnosed 04/28/2019   History of depression 05/10/2019   UTI (urinary tract infection) 07/21/2020    Patient Active Problem List   Diagnosis Date Noted   Anxiety and depression 07/24/2020   History of depression 05/10/2019   History of COVID-19 05/10/2019    Past Surgical History:  Procedure Laterality Date   NO PAST SURGERIES     no surgical history      OB History     Gravida  1   Para  1   Term  1   Preterm      AB      Living  1      SAB      IAB      Ectopic      Multiple  0   Live Births  1            Home Medications    Prior to Admission medications   Medication Sig Start Date End Date Taking? Authorizing Provider  Drospirenone-Estetrol (NEXTSTELLIS) 3-14.2 MG TABS Take 1 tablet by mouth daily at 6 (six) AM. 05/11/22   Rubie Maid, MD  metroNIDAZOLE (METROGEL) 0.75 % vaginal gel Place 1 Applicatorful vaginally at bedtime. Apply one applicatorful to vagina at bedtime for 10 days, then twice a week for 6 months. 05/13/22   Rubie Maid, MD  ferrous sulfate 324 MG TBEC Take 324 mg by mouth. Patient not taking: No sig reported  03/24/20  [provider]  sertraline (ZOLOFT) 50 MG tablet Take 1 tablet (50 mg total) by mouth daily. Patient not taking: No sig reported 10/08/19 03/24/20  Diona Fanti, CNM    Family History Family  History  Problem Relation Age of Onset   Migraines Mother    Seizures Mother    Schizophrenia Father    Diabetes Maternal Grandmother    Heart disease Maternal Grandmother    Hypertension Maternal Grandmother    Bone cancer Maternal Grandmother    Colon cancer Maternal Grandfather    Breast cancer Paternal Grandmother     Social History Social History   Tobacco Use   Smoking status: Never   Smokeless tobacco: Never  Vaping Use   Vaping Use: Never used  Substance Use Topics   Alcohol use: No   Drug use: No     Allergies   Lemon oil and Vaccinium angustifolium   Review of Systems Review of Systems   Physical Exam Triage Vital Signs ED Triage Vitals  Enc Vitals Group     BP 06/29/22 1037 109/68     Pulse Rate 06/29/22 1037 79     Resp 06/29/22 1037 16     Temp 06/29/22 1037 98.2 F (36.8 C)     Temp src --      SpO2 06/29/22 1037 98 %  Weight --      Height --      Head Circumference --      Peak Flow --      Pain Score 06/29/22 1032 0     Pain Loc --      Pain Edu? --      Excl. in [place]? --    No data found.  Updated Vital Signs BP 109/68   Pulse 79   Temp 98.2 F (36.8 C)   Resp 16   SpO2 98%   Visual Acuity Right Eye Distance:   Left Eye Distance:   Bilateral Distance:    Right Eye Near:   Left Eye Near:    Bilateral Near:     Physical Exam   UC Treatments / Results  Labs (all labs ordered are listed, but only abnormal results are displayed) Labs Reviewed - No data to display  EKG   Radiology No results found.  Procedures Procedures (including critical care time)  Medications Ordered in UC Medications - No data to display  Initial Impression / Assessment and Plan / UC Course  I have reviewed the triage vital signs and the nursing notes.  Pertinent labs & imaging results that were available during my care of the patient were reviewed by me and considered in my medical decision making (see chart for details).    Hyper-extension injury of right thumb.  Fitting for thumb spica and recommending ice to reduce swelling, NSAIDs for pain control.  Return precautions discussed.  Patient acknowledges understanding treatment plan and agreement.   Final Clinical Impressions(s) / UC Diagnoses   Final diagnoses:  None   Discharge Instructions   None    ED Prescriptions   None    PDMP not reviewed this encounter.   Rose Phi, [place] 06/29/22 1053

## 2022-06-29 NOTE — ED Triage Notes (Signed)
Patient to Urgent Care with complaints of right sided wrist thumb, reports possibly straining her right thumb, states she was moving her son's crib and believes she could have jammed it. Injury occurred yesterday afternoon.   Has been applying ice. Swelling improved.

## 2022-11-18 ENCOUNTER — Telehealth: Payer: Self-pay | Admitting: Family Medicine

## 2022-11-18 NOTE — Telephone Encounter (Signed)
She is returning the missed call

## 2022-11-18 NOTE — Telephone Encounter (Signed)
LM for pt to return my call

## 2022-11-18 NOTE — Telephone Encounter (Signed)
Spoke with patient.  Appointment made for 8/2 @ 10:30 am

## 2022-11-18 NOTE — Telephone Encounter (Signed)
Pt called to make an appointment+ made it for 08/14 but she says she was reffered to [place] by urgent care. She would like to speak to a nurse about coming in sooner .

## 2022-11-19 ENCOUNTER — Ambulatory Visit: Payer: Medicaid Other

## 2022-11-22 ENCOUNTER — Ambulatory Visit: Payer: Medicaid Other | Admitting: Family Medicine

## 2022-11-22 ENCOUNTER — Encounter: Payer: Self-pay | Admitting: Family Medicine

## 2022-11-22 DIAGNOSIS — B9689 Other specified bacterial agents as the cause of diseases classified elsewhere: Secondary | ICD-10-CM

## 2022-11-22 DIAGNOSIS — F419 Anxiety disorder, unspecified: Secondary | ICD-10-CM

## 2022-11-22 DIAGNOSIS — A530 Latent syphilis, unspecified as early or late: Secondary | ICD-10-CM

## 2022-11-22 DIAGNOSIS — Z113 Encounter for screening for infections with a predominantly sexual mode of transmission: Secondary | ICD-10-CM

## 2022-11-22 LAB — HM HIV SCREENING LAB: HM HIV Screening: NEGATIVE

## 2022-11-22 MED ORDER — PENICILLIN G BENZATHINE 1200000 UNIT/2ML IM SUSY
2.4000 10*6.[IU] | PREFILLED_SYRINGE | Freq: Once | INTRAMUSCULAR | Status: AC
  Administered 2022-11-22: 2.4 10*6.[IU] via INTRAMUSCULAR

## 2022-11-22 MED ORDER — METRONIDAZOLE 500 MG PO TABS: 500.0000 mg | ORAL_TABLET | Freq: Two times a day (BID) | ORAL | Status: AC

## 2022-11-22 NOTE — Progress Notes (Signed)
[facility] Department  STI clinic/screening visit [address] [place] [address] [phone]  Subjective:  Kristen Pittman is a 22 y.o. female being seen today for an STI screening visit. The patient reports they do have symptoms.  Patient reports that they do not desire a pregnancy in the next year.   They reported they are not interested in discussing contraception today.    Patient's last menstrual period was 08/23/2022.  Patient has the following medical conditions:   Patient Active Problem List   Diagnosis Date Noted   Anxiety and depression 07/24/2020   History of depression 05/10/2019   History of COVID-19 05/10/2019    Chief Complaint  Patient presents with   SEXUALLY TRANSMITTED DISEASE    HPI  Patient reports to clinic with a positive RPR, states she had a store on her vaginal area about 1 month ago. It was painless. The sore is now gone. Reports no other symptoms   Does the patient using douching products? Practitioner oversight- forgot to order  Last HIV test per patient/review of record was  Lab Results  Component Value Date   HMHIVSCREEN Negative - Validated 01/16/2018    Lab Results  Component Value Date   HIV Non Reactive 02/22/2020   Patient reports last pap was No results found for: "DIAGPAP" No results found for: "SPECADGYN"  Screening for MPX risk: Does the patient have an unexplained rash? No Is the patient MSM? No Does the patient endorse multiple sex partners or anonymous sex partners? No Did the patient have close or sexual contact with a person diagnosed with MPX? No Has the patient traveled outside the [place] where MPX is endemic? No Is there a high clinical suspicion for MPX-- evidenced by one of the following No  -Unlikely to be chickenpox  -Lymphadenopathy  -Rash that present in same phase of evolution on any given body part See flowsheet for further details and programmatic requirements.   Immunization  history:  Immunization History  Administered Date(s) Administered   Dtap, Unspecified 02/28/2002, 05/02/2002, 07/04/2002, 01/01/2004, 08/29/2006   HPV 9-valent 04/30/2016, 05/03/2017   Hepatitis A 01/27/2005, 08/29/2006   Hepatitis B 02/28/2002, 05/02/2002, 07/04/2002   IPV 02/28/2002, 05/02/2002, 07/04/2002, 08/29/2006   Influenza,inj,Quad PF,6+ Mos 01/03/2015, 04/30/2016, 05/03/2017, 01/12/2019, 02/22/2020   MMR 01/30/2003, 08/29/2006   Meningococcal B, OMV 10/11/2018, 01/12/2019   Meningococcal Conjugate 11/05/2014, 10/11/2018   Tdap 11/05/2014, 07/05/2019   Varicella 04/21/2002, 08/29/2006     The following portions of the patient's history were reviewed and updated as appropriate: allergies, current medications, past medical history, past social history, past surgical history and problem list.  Objective:  There were no vitals filed for this visit.  Physical Exam Vitals and nursing note reviewed.  Constitutional:      Appearance: Normal appearance.  HENT:     Head: Normocephalic and atraumatic.     Mouth/Throat:     Mouth: Mucous membranes are moist.     Pharynx: Oropharynx is clear. No oropharyngeal exudate or posterior oropharyngeal erythema.  Pulmonary:     Effort: Pulmonary effort is normal.  Abdominal:     General: Abdomen is flat.     Palpations: There is no mass.     Tenderness: There is no abdominal tenderness. There is no rebound.  Genitourinary:    General: Normal vulva.     Exam position: Lithotomy position.     Pubic Area: No rash or pubic lice.      Tanner stage (genital): 5.  Labia:        Right: No rash or lesion.        Left: No rash or lesion.      Vagina: Vaginal discharge present. No erythema, bleeding or lesions.     Cervix: No cervical motion tenderness, discharge, friability, lesion or erythema.     Uterus: Normal.      Adnexa: Right adnexa normal and left adnexa normal.     Rectum: Normal.  Lymphadenopathy:     Head:     Right side of  head: No preauricular or posterior auricular adenopathy.     Left side of head: No preauricular or posterior auricular adenopathy.     Cervical: No cervical adenopathy.     Upper Body:     Right upper body: No supraclavicular, axillary or epitrochlear adenopathy.     Left upper body: No supraclavicular, axillary or epitrochlear adenopathy.     Lower Body: No right inguinal adenopathy. No left inguinal adenopathy.  Skin:    General: Skin is warm and dry.     Findings: No rash.  Neurological:     Mental Status: She is alert and oriented to person, place, and time.      Assessment and Plan:  Kristen Pittman is a 22 y.o. female presenting to the [facility] Department for STI screening  1. Screening for venereal disease  - Chlamydia/Gonorrhea [place] [facility] - HIV [place] [facility] - Syphilis Serology, [place] [facility] - WET PREP FOR TRICH, YEAST, CLUE - Gonococcus culture  2. Positive RPR test -reports 1 lesion that was there about 1 month ago- but was not painful and has since disappeared, no lesions visualized -repeat testing today -treatment with 1 x bicillin   3. Anxiety  - Ambulatory referral to [facility]   Patient accepted all screenings including oral, vaginal CT/GC and bloodwork for HIV/RPR, and wet prep. Patient meets criteria for HepB screening? No. Ordered? not applicable Patient meets criteria for HepC screening? No. Ordered? not applicable  Treat wet prep per standing order Discussed time line for [facility] results and that patient will be called with positive results and encouraged patient to call if she had not heard in 2 weeks.  Counseled to return or seek care for continued or worsening symptoms Recommended repeat testing in 3 months with positive results. Recommended condom use with all sex  Patient is currently using     to prevent nothing pregnancy.    Return if symptoms worsen or fail to improve, for STI screening.  No future  appointments. Total time spent 20 minutes  Lenice Llamas, [place]

## 2022-11-22 NOTE — Progress Notes (Signed)
Pt is here for STD screening and treatment of Syphilis. Wet mount results reviewed.  The patient was dispensed Metronidazole 500 mg #14 today. I provided counseling today regarding the medication. We discussed the medication, the side effects and when to call clinic. Patient given the opportunity to ask questions. Questions answered.   Bicillin 2.4 MU given IM.  Pt tolerated well.  Condoms given.  Berdie Ogren, RN

## 2022-11-22 NOTE — Addendum Note (Signed)
Addended by: Berdie Ogren on: 11/22/2022 05:45 PM   Modules accepted: Orders

## 2022-12-01 ENCOUNTER — Ambulatory Visit: Payer: Medicaid Other

## 2022-12-07 ENCOUNTER — Telehealth: Payer: Self-pay | Admitting: Family Medicine

## 2022-12-07 NOTE — Telephone Encounter (Signed)
Patient would like someone to go over STI results

## 2022-12-08 NOTE — Telephone Encounter (Signed)
Spoke to patient regarding her lab results and  informed her to have repeat Syphilis testing in 6 months.  Berdie Ogren, RN

## 2023-01-10 ENCOUNTER — Ambulatory Visit: Payer: Medicaid Other

## 2023-01-10 DIAGNOSIS — R399 Unspecified symptoms and signs involving the genitourinary system: Secondary | ICD-10-CM | POA: Insufficient documentation

## 2023-01-10 DIAGNOSIS — N912 Amenorrhea, unspecified: Secondary | ICD-10-CM | POA: Insufficient documentation

## 2023-01-21 ENCOUNTER — Ambulatory Visit
Admission: RE | Admit: 2023-01-21 | Discharge: 2023-01-21 | Disposition: A | Discharge status: Home / Self Care | Payer: Medicaid Other | Source: Ambulatory Visit | Attending: Emergency Medicine

## 2023-01-21 VITALS — BP 110/74 | HR 87 | Temp 97.5°F | Resp 18

## 2023-01-21 DIAGNOSIS — Z3202 Encounter for pregnancy test, result negative: Secondary | ICD-10-CM

## 2023-01-21 DIAGNOSIS — R350 Frequency of micturition: Secondary | ICD-10-CM

## 2023-01-21 LAB — POCT URINALYSIS DIP (MANUAL ENTRY)
Bilirubin, UA: NEGATIVE
Blood, UA: NEGATIVE
Glucose, UA: NEGATIVE mg/dL
Ketones, POC UA: NEGATIVE mg/dL
Nitrite, UA: NEGATIVE
Protein Ur, POC: NEGATIVE mg/dL
Spec Grav, UA: 1.015 (ref 1.010–1.025)
Urobilinogen, UA: 0.2 U/dL
pH, UA: 7 (ref 5.0–8.0)

## 2023-01-21 LAB — POCT URINE PREGNANCY: Preg Test, Ur: NEGATIVE

## 2023-01-21 NOTE — ED Provider Notes (Signed)
Renaldo Fiddler    CSN: [id number] Arrival date & time: 01/21/23  1006      History   Chief Complaint Chief Complaint  Patient presents with   Possible Pregnancy    Entered by patient    HPI Kristen Pittman is a 22 y.o. female.  Patient presents with request for pregnancy test.  She reports urinary frequency and bilateral breast tenderness.  LMP 12/07/2022.  She is sexually active and does not use birth control.  She denies abdominal pain, dysuria, hematuria, vaginal discharge, pelvic pain, or other symptoms.  Her medical history includes amenorrhea.  The history is provided by the patient and medical records.    Past Medical History:  Diagnosis Date   Anemia    History of COVID-19 05/10/2019   Diagnosed 04/28/2019   History of depression 05/10/2019   UTI (urinary tract infection) 07/21/2020    Patient Active Problem List   Diagnosis Date Noted   Amenorrhea 01/10/2023   UTI symptoms 01/10/2023   Anxiety and depression 07/24/2020   History of depression 05/10/2019   History of COVID-19 05/10/2019    Past Surgical History:  Procedure Laterality Date   NO PAST SURGERIES     no surgical history      OB History     Gravida  1   Para  1   Term  1   Preterm      AB      Living  1      SAB      IAB      Ectopic      Multiple  0   Live Births  1            Home Medications    Prior to Admission medications   Medication Sig Start Date End Date Taking? Authorizing Provider  Drospirenone-Estetrol (NEXTSTELLIS) 3-14.2 MG TABS Take 1 tablet by mouth daily at 6 (six) AM. Patient not taking: Reported on 01/21/2023 05/11/22   Hildred Laser, MD  metroNIDAZOLE (METROGEL) 0.75 % vaginal gel Place 1 Applicatorful vaginally at bedtime. Apply one applicatorful to vagina at bedtime for 10 days, then twice a week for 6 months. Patient not taking: Reported on 01/21/2023 05/13/22   Hildred Laser, MD  ferrous sulfate 324 MG TBEC Take 324 mg by  mouth. Patient not taking: No sig reported  03/24/20  [provider]  sertraline (ZOLOFT) 50 MG tablet Take 1 tablet (50 mg total) by mouth daily. Patient not taking: No sig reported 10/08/19 03/24/20  Gunnar Bulla, CNM    Family History Family History  Problem Relation Age of Onset   Migraines Mother    Seizures Mother    Schizophrenia Father    Diabetes Maternal Grandmother    Heart disease Maternal Grandmother    Hypertension Maternal Grandmother    Bone cancer Maternal Grandmother    Colon cancer Maternal Grandfather    Breast cancer Paternal Grandmother     Social History Social History   Tobacco Use   Smoking status: Never   Smokeless tobacco: Never  Vaping Use   Vaping status: Never Used  Substance Use Topics   Alcohol use: No   Drug use: No     Allergies   Lemon oil and Vaccinium angustifolium   Review of Systems Review of Systems  Constitutional:  Negative for chills and fever.  Gastrointestinal:  Negative for abdominal pain, constipation, diarrhea, nausea and vomiting.  Genitourinary:  Positive for frequency. Negative for dysuria, flank pain,  hematuria, pelvic pain and vaginal discharge.     Physical Exam Triage Vital Signs ED Triage Vitals [01/21/23 1034]  Encounter Vitals Group     BP 110/74     Systolic BP Percentile      Diastolic BP Percentile      Pulse Rate 87     Resp 18     Temp (!) 97.5 F (36.4 C)     Temp Source Oral     SpO2 98 %     Weight      Height      Head Circumference      Peak Flow      Pain Score 0     Pain Loc      Pain Education      Exclude from Growth Chart    No data found.  Updated Vital Signs BP 110/74   Pulse 87   Temp (!) 97.5 F (36.4 C) (Oral)   Resp 18   LMP 12/07/2022   SpO2 98%   Visual Acuity Right Eye Distance:   Left Eye Distance:   Bilateral Distance:    Right Eye Near:   Left Eye Near:    Bilateral Near:     Physical Exam Vitals and nursing note reviewed.   Constitutional:      General: She is not in acute distress.    Appearance: She is well-developed.  HENT:     Mouth/Throat:     Mouth: Mucous membranes are moist.  Cardiovascular:     Rate and Rhythm: Normal rate and regular rhythm.     Heart sounds: Normal heart sounds.  Pulmonary:     Effort: Pulmonary effort is normal. No respiratory distress.     Breath sounds: Normal breath sounds.  Abdominal:     General: Bowel sounds are normal.     Palpations: Abdomen is soft.     Tenderness: There is no abdominal tenderness. There is no right CVA tenderness, left CVA tenderness, guarding or rebound.  Musculoskeletal:     Cervical back: Neck supple.  Skin:    General: Skin is warm and dry.  Neurological:     Mental Status: She is alert.      UC Treatments / Results  Labs (all labs ordered are listed, but only abnormal results are displayed) Labs Reviewed  POCT URINALYSIS DIP (MANUAL ENTRY) - Abnormal; Notable for the following components:      Result Value   Color, UA light yellow (*)    Leukocytes, UA Trace (*)    All other components within normal limits  POCT URINE PREGNANCY    EKG   Radiology No results found.  Procedures Procedures (including critical care time)  Medications Ordered in UC Medications - No data to display  Initial Impression / Assessment and Plan / UC Course  I have reviewed the triage vital signs and the nursing notes.  Pertinent labs & imaging results that were available during my care of the patient were reviewed by me and considered in my medical decision making (see chart for details).    Negative pregnancy test, urinary frequency.  Urine pregnancy test negative.  Education provided on urinary frequency.  Instructed patient to follow-up with her PCP if her symptoms are not improving.  She agrees to plan of care.  Final Clinical Impressions(s) / UC Diagnoses   Final diagnoses:  Negative pregnancy test  Urinary frequency     Discharge  Instructions      Your pregnancy test is  negative.  Follow-up with your primary care provider if your symptoms are not improving.      ED Prescriptions   None    PDMP not reviewed this encounter.   Mickie Bail, NP 01/21/23 1049

## 2023-01-21 NOTE — Discharge Instructions (Addendum)
Your pregnancy test is negative.  Follow-up with your primary care provider if your symptoms are not improving.

## 2023-01-21 NOTE — ED Triage Notes (Signed)
Patient to Urgent Care with concerns about a possible pregnancy. Reports her LMP was 8/20. Took a home pregnancy test two weeks ago that was negative. No birth control usage.   Reports she has had some urinary frequency and bilateral breast tenderness.

## 2023-03-24 IMAGING — DX DG KNEE 1-2V PORT*R*
3 series · 3 of 3 positions shown · non-contrast
Comparison: None.

CLINICAL DATA: Fall with knee deformity, presumed dislocation

EXAM:
PORTABLE RIGHT KNEE - 1-2 VIEW performed 10/27/2020 at [DATE] p.m
RIGHT KNEE - 1-2 VIEW performed 10/27/2020 at [DATE] p.m.

[knee ap (1 of 2)]
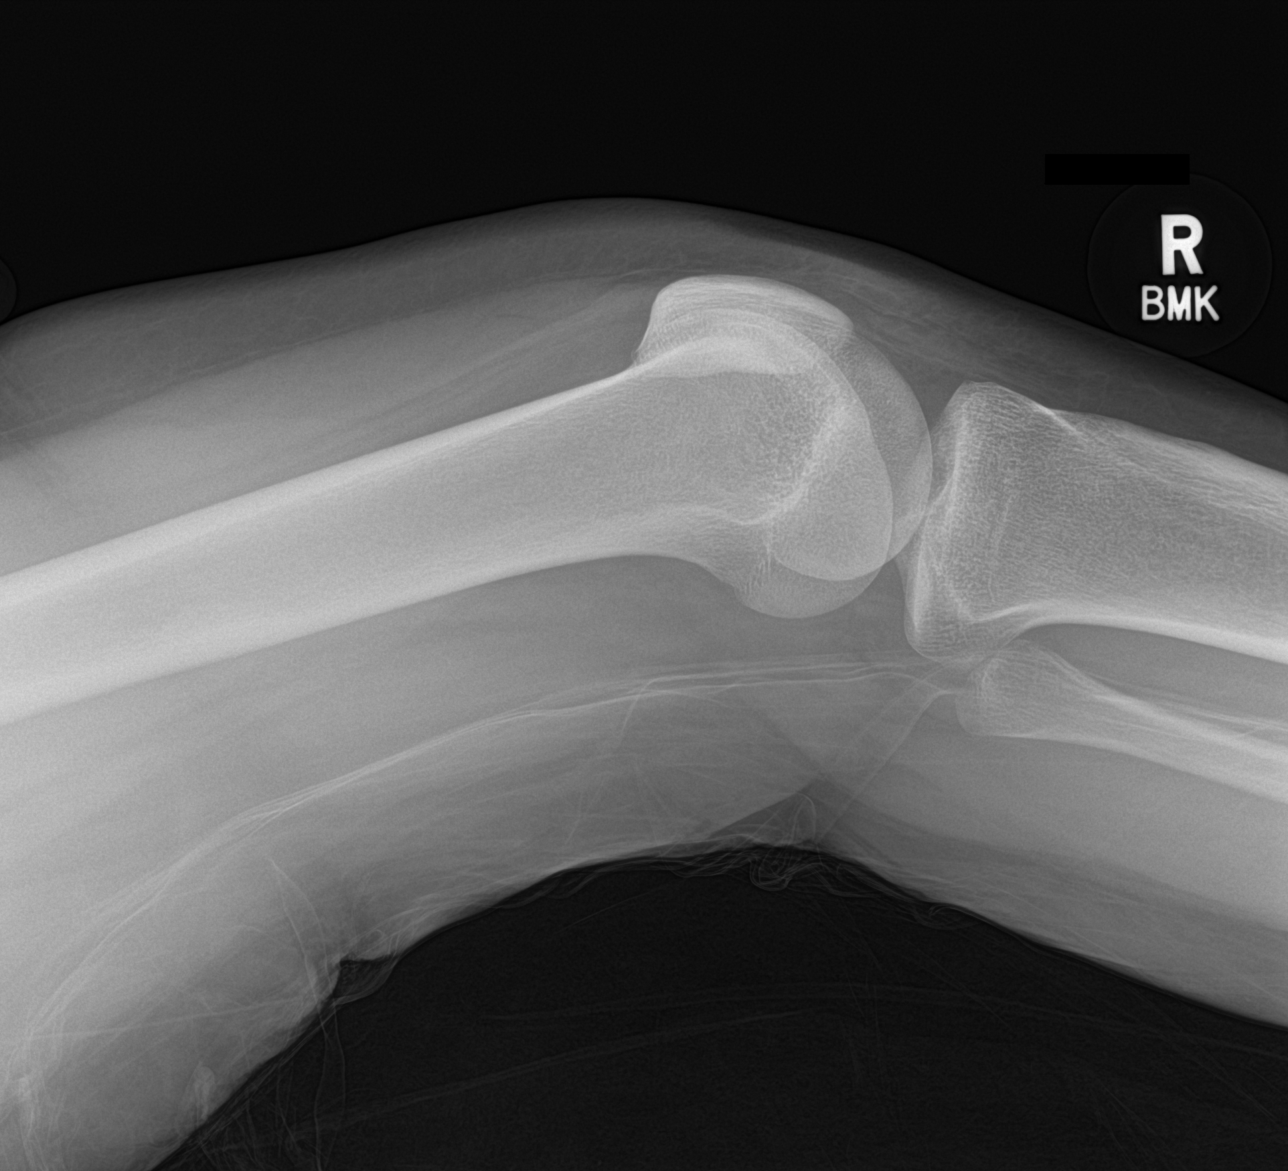

[patella ap]
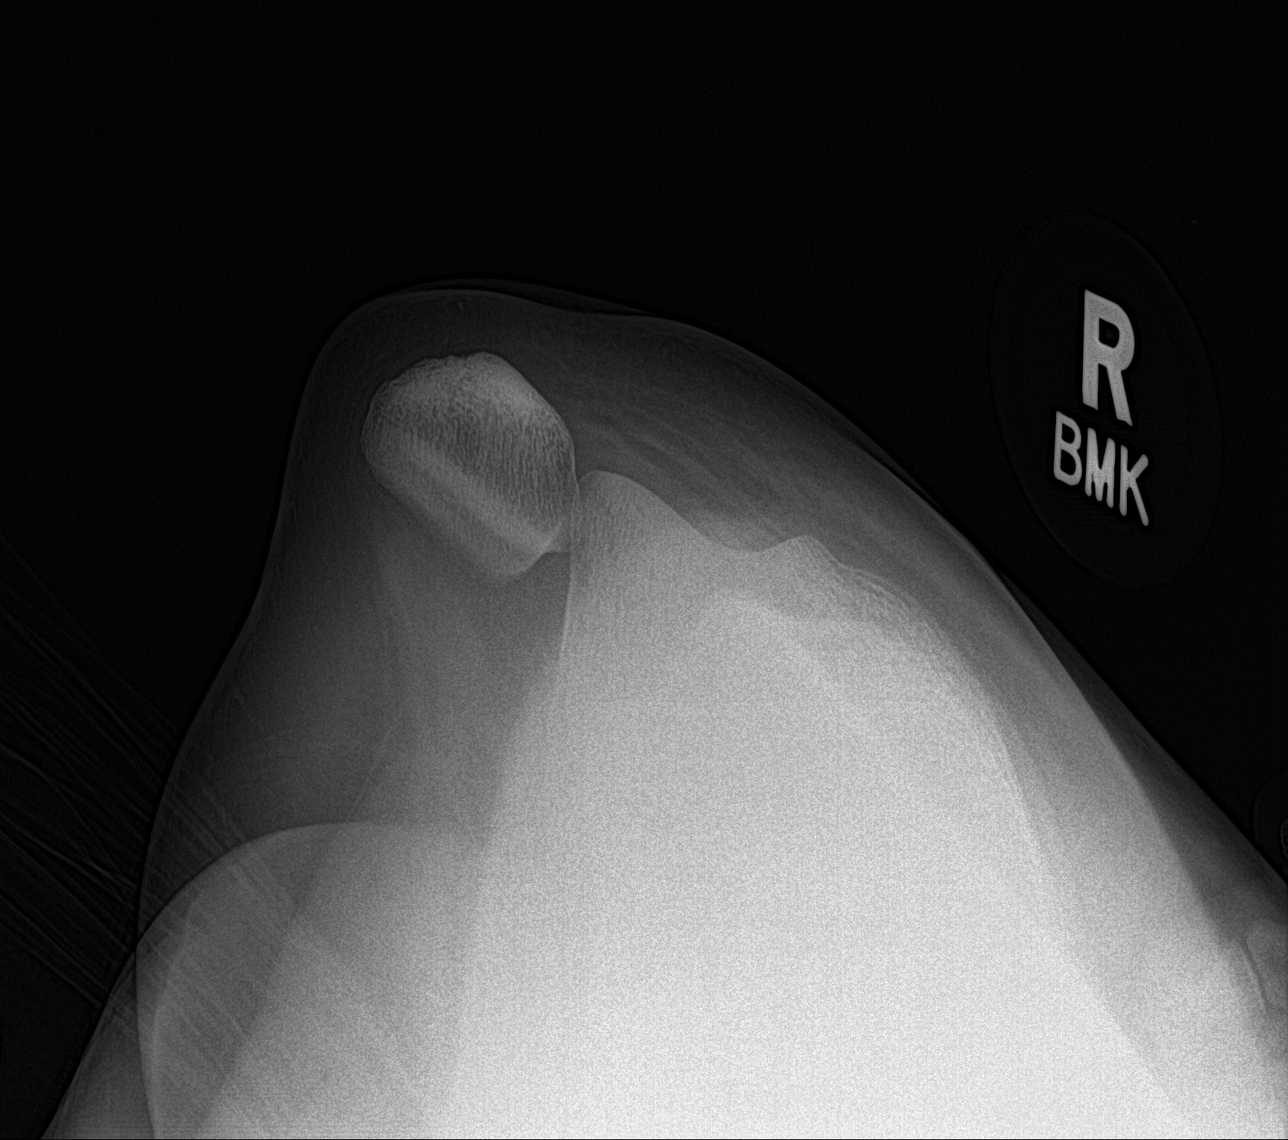

[knee ap (2 of 2)]
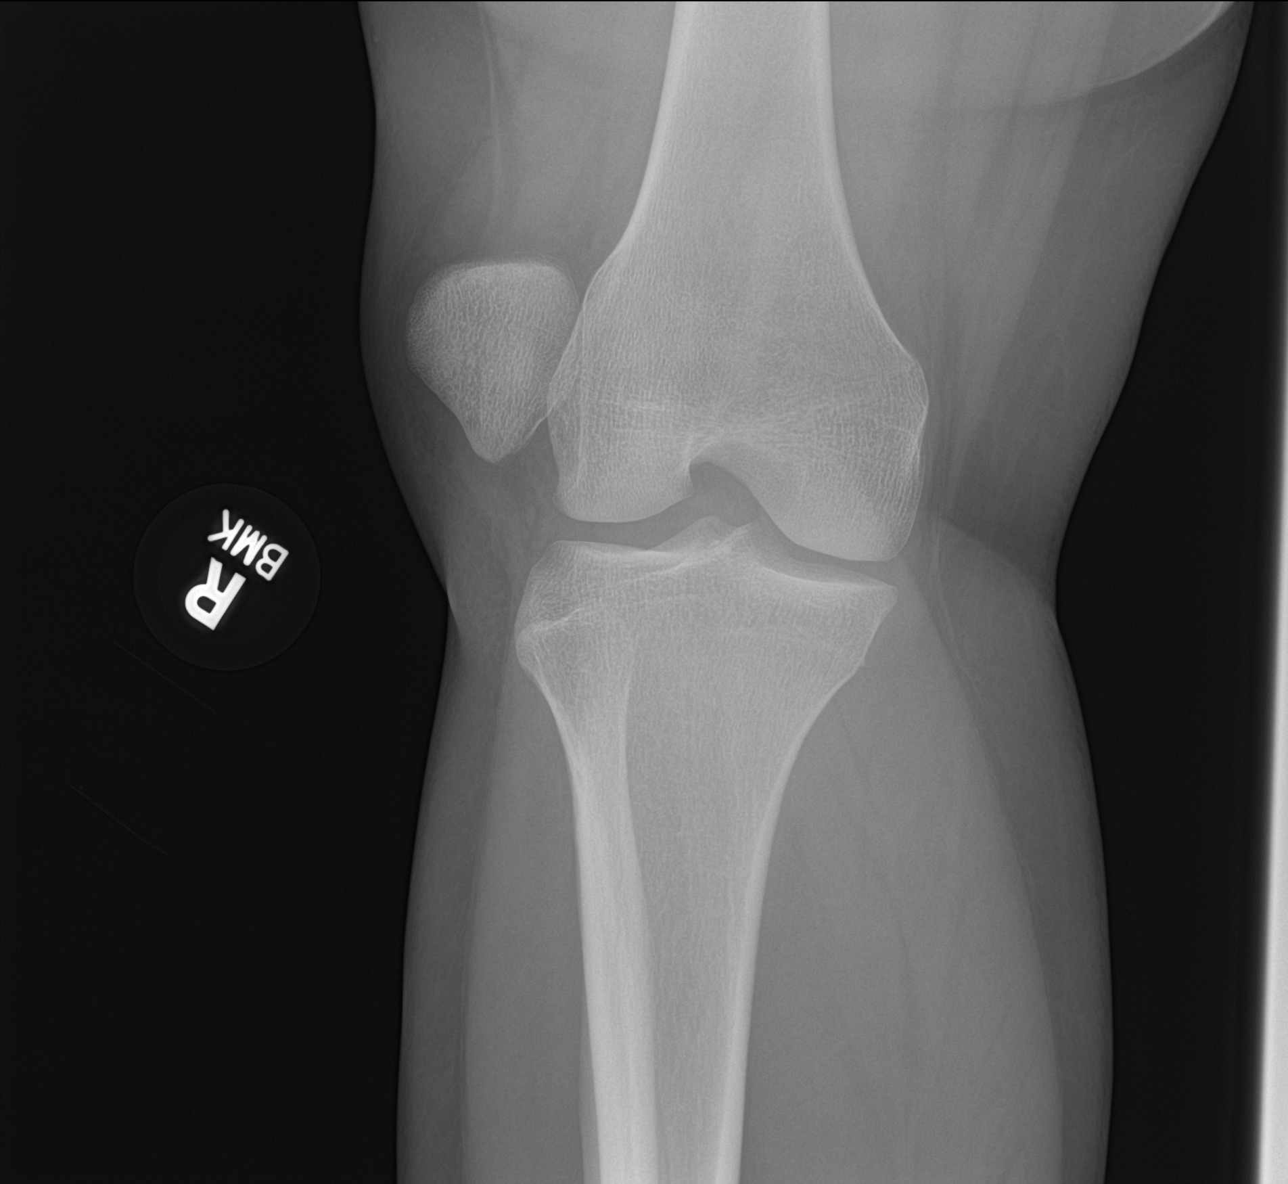

[3 of 3 positions shown; findings below may reference images not displayed]

FINDINGS: Initial radiographic images demonstrate a lateral patellar
dislocation with associated soft tissue deformity and small knee
joint effusion as well as anterior soft tissue swelling. No
discernible acute fracture or other traumatic malalignment is
discernible on the initial images.

Some subsequent post reduction views demonstrate a more normal
appearance of the patella, appearing seated in the expected location
of the femoral trochlea although could be better assessed with a
dedicated patellar view. Small knee joint effusion. Mild anterior
swelling.
IMPRESSION: 1. Initial images demonstrating a lateral patellar dislocation.
2. Subsequent post reduction images demonstrating a grossly normal
location though could be better assessed with a dedicated axial
sunrise/patellar view.
3. Mild swelling anteriorly and small joint effusion.

## 2023-03-24 IMAGING — DX DG KNEE 1-2V*R*
2 series · 2 of 2 positions shown · non-contrast
Comparison: None.

CLINICAL DATA: Fall with knee deformity, presumed dislocation

EXAM:
PORTABLE RIGHT KNEE - 1-2 VIEW performed 10/27/2020 at [DATE] p.m
RIGHT KNEE - 1-2 VIEW performed 10/27/2020 at [DATE] p.m.

[knee ap]
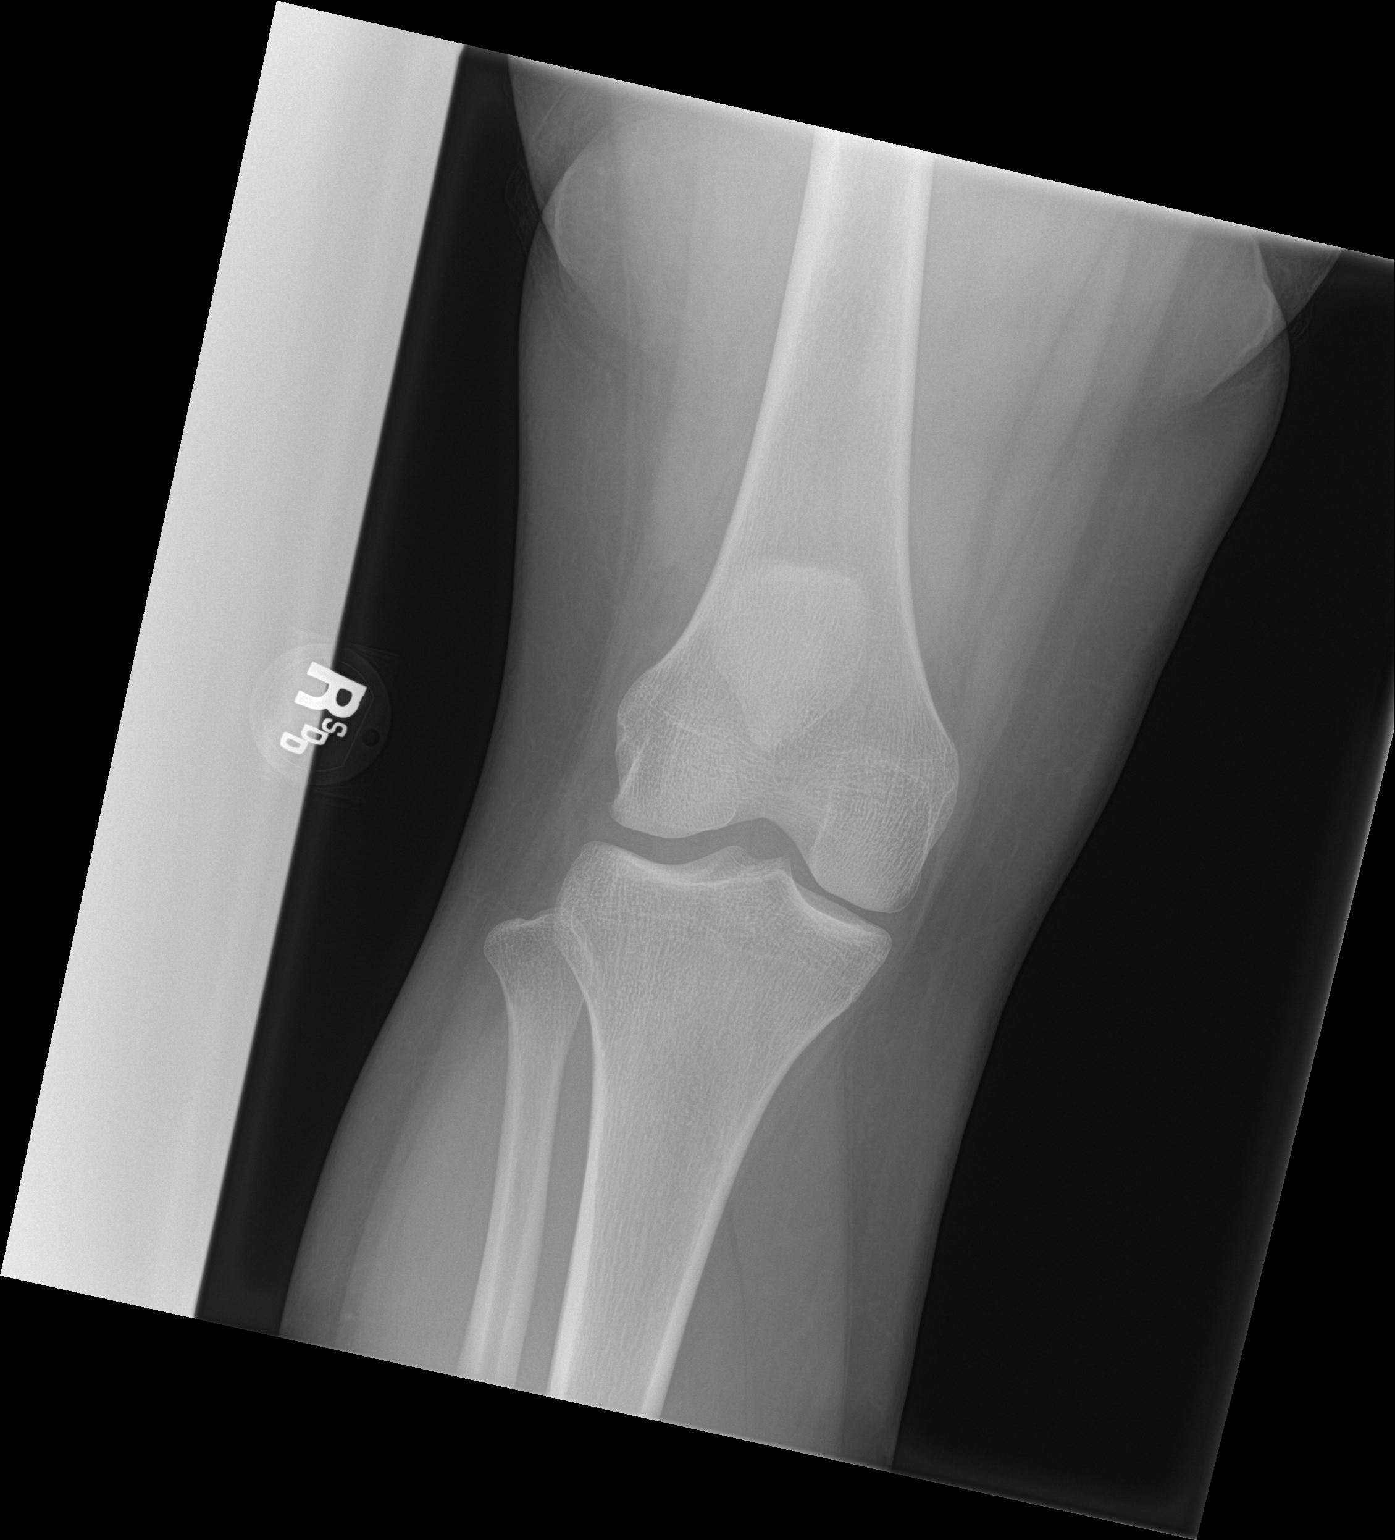

[knee lat]
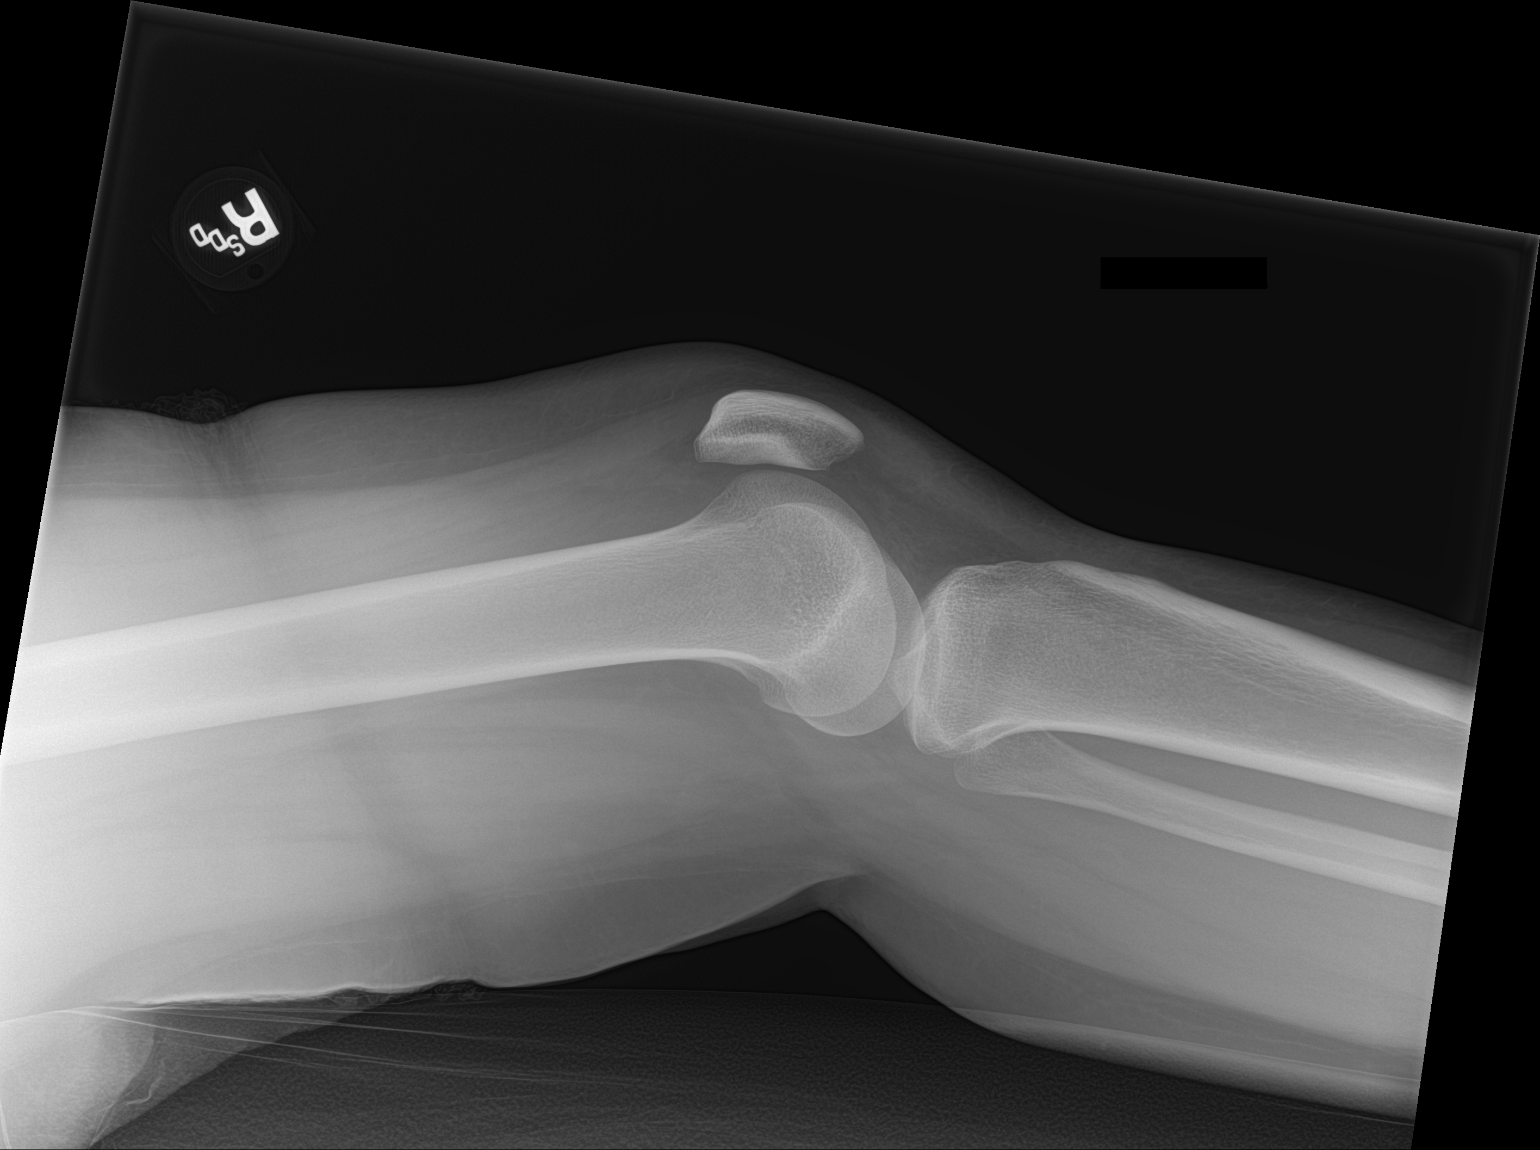

[2 of 2 positions shown; findings below may reference images not displayed]

FINDINGS: Initial radiographic images demonstrate a lateral patellar
dislocation with associated soft tissue deformity and small knee
joint effusion as well as anterior soft tissue swelling. No
discernible acute fracture or other traumatic malalignment is
discernible on the initial images.

Some subsequent post reduction views demonstrate a more normal
appearance of the patella, appearing seated in the expected location
of the femoral trochlea although could be better assessed with a
dedicated patellar view. Small knee joint effusion. Mild anterior
swelling.
IMPRESSION: 1. Initial images demonstrating a lateral patellar dislocation.
2. Subsequent post reduction images demonstrating a grossly normal
location though could be better assessed with a dedicated axial
sunrise/patellar view.
3. Mild swelling anteriorly and small joint effusion.

## 2023-06-01 ENCOUNTER — Encounter: Payer: Self-pay | Admitting: Internal Medicine

## 2023-06-10 ENCOUNTER — Other Ambulatory Visit: Payer: Self-pay | Admitting: Internal Medicine

## 2023-06-10 DIAGNOSIS — N6310 Unspecified lump in the right breast, unspecified quadrant: Secondary | ICD-10-CM

## 2023-06-12 ENCOUNTER — Telehealth: Payer: Medicaid Other | Admitting: Family

## 2023-06-12 DIAGNOSIS — Z3200 Encounter for pregnancy test, result unknown: Secondary | ICD-10-CM

## 2023-06-12 DIAGNOSIS — L0291 Cutaneous abscess, unspecified: Secondary | ICD-10-CM

## 2023-06-12 MED ORDER — CEPHALEXIN 500 MG PO CAPS
500.0000 mg | ORAL_CAPSULE | Freq: Three times a day (TID) | ORAL | 0 refills | Status: DC
Start: 2023-06-12 — End: 2023-12-16

## 2023-06-12 NOTE — Progress Notes (Signed)
 Virtual Visit Consent   Kristen Pittman, you are scheduled for a virtual visit with a [facility] provider today. Just as with appointments in the office, your consent must be obtained to participate. Your consent will be active for this visit and any virtual visit you may have with one of our providers in the next 365 days. If you have a MyChart account, a copy of this consent can be sent to you electronically.  As this is a virtual visit, video technology does not allow for your provider to perform a traditional examination. This may limit your provider's ability to fully assess your condition. If your provider identifies any concerns that need to be evaluated in person or the need to arrange testing (such as labs, EKG, etc.), we will make arrangements to do so. Although advances in technology are sophisticated, we cannot ensure that it will always work on either your end or our end. If the connection with a video visit is poor, the visit may have to be switched to a telephone visit. With either a video or telephone visit, we are not always able to ensure that we have a secure connection.  By engaging in this virtual visit, you consent to the provision of healthcare and authorize for your insurance to be billed (if applicable) for the services provided during this visit. Depending on your insurance coverage, you may receive a charge related to this service.  I need to obtain your verbal consent now. Are you willing to proceed with your visit today? Kristen Pittman has provided verbal consent on 06/12/2023 for a virtual visit (video or telephone). Jannifer Rodney, FNP  Date: 06/12/2023 6:37 PM   Virtual Visit via Video Note   I, Jannifer Rodney, connected with  Kristen Pittman  ([id number], [birth year]) on 06/12/23 at  6:30 PM EST by a video-enabled telemedicine application and verified that I am speaking with the correct person using two identifiers.  Location: Patient: Virtual  Visit Location Patient: Home Provider: Virtual Visit Location Provider: Home Office   I discussed the limitations of evaluation and management by telemedicine and the availability of in person appointments. The patient expressed understanding and agreed to proceed.    History of Present Illness: Kristen Pittman is a 23 y.o. who identifies as a female who was assigned female at birth, and is being seen today for faint positive pregnancy test X2. She reports two weeks ago she thought she saw a faint line and this morning she saw a faint line.  She reports her last menstrual cycle was in December, but had last week had two days of spotting. She reports her menstrual cycle are usually irregular.   She also complaining of an abscess left groin that she noticed it two days ago. Reports the area is swollen and tender.   HPI: HPI  Problems:  Patient Active Problem List   Diagnosis Date Noted   Amenorrhea 01/10/2023   UTI symptoms 01/10/2023   Anxiety and depression 07/24/2020   History of depression 05/10/2019   History of COVID-19 05/10/2019    Allergies:  Allergies  Allergen Reactions   Lemon Oil Swelling    Throat itching and entire body itchy   Vaccinium Angustifolium Swelling and Rash    blueberries   Medications:  Current Outpatient Medications:    cephALEXin (KEFLEX) 500 MG capsule, Take 1 capsule (500 mg total) by mouth 3 (three) times daily., Disp: 21 capsule, Rfl: 0   Drospirenone-Estetrol (NEXTSTELLIS) 3-14.2 MG TABS, Take 1 tablet by mouth  daily at 6 (six) AM. (Patient not taking: Reported on 01/21/2023), Disp: 28 tablet, Rfl: 0   metroNIDAZOLE (METROGEL) 0.75 % vaginal gel, Place 1 Applicatorful vaginally at bedtime. Apply one applicatorful to vagina at bedtime for 10 days, then twice a week for 6 months. (Patient not taking: Reported on 01/21/2023), Disp: 70 g, Rfl: 5  Observations/Objective: Patient is well-developed, well-nourished in no acute distress.  Resting  comfortably  at home.  Head is normocephalic, atraumatic.  No labored breathing. Speech is clear and coherent with logical content.  Patient is alert and oriented at baseline.    Assessment and Plan: 1. Possible pregnancy (Primary)  2. Abscess - cephALEXin (KEFLEX) 500 MG capsule; Take 1 capsule (500 mg total) by mouth 3 (three) times daily.  Dispense: 21 capsule; Refill: 0  Pt will call PCP and make follow up Recommend starting prenatal  Warm compresses for abscess Start keflex TID if abscess does not improve or worsen    Follow Up Instructions: I discussed the assessment and treatment plan with the patient. The patient was provided an opportunity to ask questions and all were answered. The patient agreed with the plan and demonstrated an understanding of the instructions.  A copy of instructions were sent to the patient via MyChart unless otherwise noted below.     The patient was advised to call back or seek an in-person evaluation if the symptoms worsen or if the condition fails to improve as anticipated.    Jannifer Rodney, FNP

## 2023-06-30 ENCOUNTER — Other Ambulatory Visit

## 2023-07-09 ENCOUNTER — Telehealth: Admitting: Nurse Practitioner

## 2023-07-09 ENCOUNTER — Telehealth

## 2023-07-10 ENCOUNTER — Telehealth

## 2023-07-10 ENCOUNTER — Telehealth: Admitting: Physician Assistant

## 2023-07-10 DIAGNOSIS — N644 Mastodynia: Secondary | ICD-10-CM | POA: Diagnosis not present

## 2023-07-10 NOTE — Progress Notes (Signed)
 Virtual Visit Consent   Kristen Pittman, you are scheduled for a virtual visit with a [facility] provider today. Just as with appointments in the office, your consent must be obtained to participate. Your consent will be active for this visit and any virtual visit you may have with one of our providers in the next 365 days. If you have a MyChart account, a copy of this consent can be sent to you electronically.  As this is a virtual visit, video technology does not allow for your provider to perform a traditional examination. This may limit your provider's ability to fully assess your condition. If your provider identifies any concerns that need to be evaluated in person or the need to arrange testing (such as labs, EKG, etc.), we will make arrangements to do so. Although advances in technology are sophisticated, we cannot ensure that it will always work on either your end or our end. If the connection with a video visit is poor, the visit may have to be switched to a telephone visit. With either a video or telephone visit, we are not always able to ensure that we have a secure connection.  By engaging in this virtual visit, you consent to the provision of healthcare and authorize for your insurance to be billed (if applicable) for the services provided during this visit. Depending on your insurance coverage, you may receive a charge related to this service.  I need to obtain your verbal consent now. Are you willing to proceed with your visit today? Kristen Pittman has provided verbal consent on 07/10/2023 for a virtual visit (video or telephone). Tylene Fantasia Ward, PA-C  Date: 07/10/2023 3:51 PM   Virtual Visit via Video Note   I, Tylene Fantasia Ward, connected with  Kristen Pittman  ([id number], [birth year]) on 07/10/23 at  3:45 PM EDT by a video-enabled telemedicine application and verified that I am speaking with the correct person using two identifiers.  Location: Patient: Virtual  Visit Location Patient: Home Provider: Virtual Visit Location Provider: Home Office   I discussed the limitations of evaluation and management by telemedicine and the availability of in person appointments. The patient expressed understanding and agreed to proceed.    History of Present Illness: Kristen Pittman is a 23 y.o. who identifies as a female who was assigned female at birth, and is being seen today for bilateral breast soreness that started about one week ago.  Pt also complains of fatigue.  She denies nipple discharge, fever.  She is not breastfeeding.  She reports her LMP was 06/04/23, reports her cycle is about one day late.  She is sexually active and not using contraception at this time.  She has not taken a pregnancy test.  HPI: HPI  Problems:  Patient Active Problem List   Diagnosis Date Noted   Amenorrhea 01/10/2023   UTI symptoms 01/10/2023   Anxiety and depression 07/24/2020   History of depression 05/10/2019   History of COVID-19 05/10/2019    Allergies:  Allergies  Allergen Reactions   Lemon Oil Swelling    Throat itching and entire body itchy   Vaccinium Angustifolium Swelling and Rash    blueberries   Medications:  Current Outpatient Medications:    cephALEXin (KEFLEX) 500 MG capsule, Take 1 capsule (500 mg total) by mouth 3 (three) times daily., Disp: 21 capsule, Rfl: 0   Drospirenone-Estetrol (NEXTSTELLIS) 3-14.2 MG TABS, Take 1 tablet by mouth daily at 6 (six) AM. (Patient not taking: Reported on 01/21/2023), Disp: 28 tablet,  Rfl: 0   metroNIDAZOLE (METROGEL) 0.75 % vaginal gel, Place 1 Applicatorful vaginally at bedtime. Apply one applicatorful to vagina at bedtime for 10 days, then twice a week for 6 months. (Patient not taking: Reported on 01/21/2023), Disp: 70 g, Rfl: 5  Observations/Objective: Patient is well-developed, well-nourished in no acute distress.  Resting comfortably at home.  Head is normocephalic, atraumatic.  No labored breathing.   Speech is clear and coherent with logical content.  Patient is alert and oriented at baseline.    Assessment and Plan: 1. Soreness breast (Primary)  Supportive care discussed.  Advised to take a home pregnancy test.  Follow Up Instructions: I discussed the assessment and treatment plan with the patient. The patient was provided an opportunity to ask questions and all were answered. The patient agreed with the plan and demonstrated an understanding of the instructions.  A copy of instructions were sent to the patient via MyChart unless otherwise noted below.     The patient was advised to call back or seek an in-person evaluation if the symptoms worsen or if the condition fails to improve as anticipated.    Tylene Fantasia Ward, PA-C

## 2023-07-10 NOTE — Patient Instructions (Addendum)
  Kristen Pittman, thank you for joining Tylene Fantasia Ward, PA-C for today's virtual visit.  While this provider is not your primary care provider (PCP), if your PCP is located in our provider database this encounter information will be shared with them immediately following your visit.   A [facility] MyChart account gives you access to today's visit and all your visits, tests, and labs performed at [facility] " click here if you don't have a [facility] MyChart account or go to [email or web address]  Consent: (Patient) Kristen Pittman provided verbal consent for this virtual visit at the beginning of the encounter.  Current Medications:  Current Outpatient Medications:    cephALEXin (KEFLEX) 500 MG capsule, Take 1 capsule (500 mg total) by mouth 3 (three) times daily., Disp: 21 capsule, Rfl: 0   Drospirenone-Estetrol (NEXTSTELLIS) 3-14.2 MG TABS, Take 1 tablet by mouth daily at 6 (six) AM. (Patient not taking: Reported on 01/21/2023), Disp: 28 tablet, Rfl: 0   metroNIDAZOLE (METROGEL) 0.75 % vaginal gel, Place 1 Applicatorful vaginally at bedtime. Apply one applicatorful to vagina at bedtime for 10 days, then twice a week for 6 months. (Patient not taking: Reported on 01/21/2023), Disp: 70 g, Rfl: 5   Medications ordered in this encounter:  No orders of the defined types were placed in this encounter.    *If you need refills on other medications prior to your next appointment, please contact your pharmacy*  Follow-Up: Call back or seek an in-person evaluation if the symptoms worsen or if the condition fails to improve as anticipated.  [facility] [phone]  Other Instructions Recommend warm compress, can take ibuprofen or tylenol as needed.  Recommend home pregnancy test.  Follow up with OBGYN.   [facility] for [facility] at [facility] [address]  [phone]   If you have been instructed to have  an in-person evaluation today at a local Urgent Care facility, please use the link below. It will take you to a list of all of our available [facility], including address, phone number and hours of operation. Please do not delay care.  [facility]  If you or a family member do not have a primary care provider, use the link below to schedule a visit and establish care. When you choose a [facility] primary care physician or advanced practice provider, you gain a long-term partner in health. Find a Primary Care Provider  Learn more about [facility]'s in-office and virtual care options: [facility] - Get Care Now

## 2023-10-28 ENCOUNTER — Telehealth

## 2023-10-31 ENCOUNTER — Telehealth: Payer: Self-pay

## 2023-10-31 NOTE — Telephone Encounter (Signed)
 TC back to pt regarding spotting early in pregnancy no answer lvm.

## 2023-11-01 ENCOUNTER — Encounter

## 2023-11-10 ENCOUNTER — Ambulatory Visit (INDEPENDENT_AMBULATORY_CARE_PROVIDER_SITE_OTHER): Admitting: *Deleted

## 2023-11-10 ENCOUNTER — Other Ambulatory Visit: Payer: Self-pay

## 2023-11-10 VITALS — BP 110/67 | HR 86 | Wt 172.0 lb

## 2023-11-10 DIAGNOSIS — Z3202 Encounter for pregnancy test, result negative: Secondary | ICD-10-CM | POA: Diagnosis not present

## 2023-11-10 DIAGNOSIS — O3680X0 Pregnancy with inconclusive fetal viability, not applicable or unspecified: Secondary | ICD-10-CM

## 2023-11-10 DIAGNOSIS — Z3201 Encounter for pregnancy test, result positive: Secondary | ICD-10-CM

## 2023-11-10 LAB — POCT URINE PREGNANCY: Preg Test, Ur: NEGATIVE

## 2023-11-10 NOTE — Progress Notes (Signed)
  Kristen Pittman here for OB intake. . Pt had a positive upt at home. LMP is 09/16/23.    UPT in office Negative. Ordered HCG for reassurance since having a positive test at home.    Pt to follow up as needed.    Wanda Buckles, RN    .

## 2023-11-11 ENCOUNTER — Ambulatory Visit: Payer: Self-pay | Admitting: Obstetrics & Gynecology

## 2023-11-11 LAB — BETA HCG QUANT (REF LAB): hCG Quant: <1 m[IU]/mL

## 2023-11-28 ENCOUNTER — Encounter: Admitting: Obstetrics and Gynecology

## 2023-12-16 ENCOUNTER — Telehealth: Admitting: Physician Assistant

## 2023-12-16 DIAGNOSIS — J039 Acute tonsillitis, unspecified: Secondary | ICD-10-CM | POA: Diagnosis not present

## 2023-12-16 MED ORDER — AMOXICILLIN 500 MG PO CAPS
500.0000 mg | ORAL_CAPSULE | Freq: Two times a day (BID) | ORAL | 0 refills | Status: AC
Start: 2023-12-16 — End: 2023-12-26

## 2023-12-16 NOTE — Progress Notes (Signed)
 Virtual Visit Consent   Kristen Pittman, you are scheduled for a virtual visit with a [facility] provider today. Just as with appointments in the office, your consent must be obtained to participate. Your consent will be active for this visit and any virtual visit you may have with one of our providers in the next 365 days. If you have a MyChart account, a copy of this consent can be sent to you electronically.  As this is a virtual visit, video technology does not allow for your provider to perform a traditional examination. This may limit your provider's ability to fully assess your condition. If your provider identifies any concerns that need to be evaluated in person or the need to arrange testing (such as labs, EKG, etc.), we will make arrangements to do so. Although advances in technology are sophisticated, we cannot ensure that it will always work on either your end or our end. If the connection with a video visit is poor, the visit may have to be switched to a telephone visit. With either a video or telephone visit, we are not always able to ensure that we have a secure connection.  By engaging in this virtual visit, you consent to the provision of healthcare and authorize for your insurance to be billed (if applicable) for the services provided during this visit. Depending on your insurance coverage, you may receive a charge related to this service.  I need to obtain your verbal consent now. Are you willing to proceed with your visit today? Kristen Pittman has provided verbal consent on 12/16/2023 for a virtual visit (video or telephone). Delon [name], PA-C  Date: 12/16/2023 2:58 PM   Virtual Visit via Video Note   I, Delon [name], connected with  Kristen Pittman  ([id number], 12-May-2000) on 12/16/23 at  2:45 PM EDT by a video-enabled telemedicine application and verified that I am speaking with the correct person using two identifiers.  Location: Patient:  Virtual Visit Location Patient: Home Provider: Virtual Visit Location Provider: Home Office   I discussed the limitations of evaluation and management by telemedicine and the availability of in person appointments. The patient expressed understanding and agreed to proceed.    History of Present Illness: Kristen Pittman is a 23 y.o. who identifies as a female who was assigned female at birth, and is being seen today for sore throat, swollen tonsil on right.  HPI: Sore Throat  This is a new problem. The current episode started in the past 7 days. The problem has been unchanged. Maximum temperature: subjective fever-chills. Associated symptoms include congestion (mild), diarrhea, headaches (intermittent), a plugged ear sensation (right), swollen glands and trouble swallowing. Pertinent negatives include no coughing, ear discharge, ear pain, hoarse voice, shortness of breath or vomiting. Associated symptoms comments: Tonsil swollen and with white exudate on right tonsil, post nasal drainage, nausea. She has had no exposure to strep or mono. Exposure to: Son is sick but different symptoms. She has tried NSAIDs for the symptoms. The treatment provided no relief.     Problems:  Patient Active Problem List   Diagnosis Date Noted   Amenorrhea 01/10/2023   UTI symptoms 01/10/2023   Anxiety and depression 07/24/2020   History of depression 05/10/2019   History of COVID-19 05/10/2019    Allergies:  Allergies  Allergen Reactions   Lemon Oil Swelling    Throat itching and entire body itchy   Vaccinium Angustifolium Swelling and Rash    blueberries   Medications:  Current Outpatient  Medications:    amoxicillin  (AMOXIL ) 500 MG capsule, Take 1 capsule (500 mg total) by mouth 2 (two) times daily for 10 days., Disp: 20 capsule, Rfl: 0   Drospirenone-Estetrol (NEXTSTELLIS ) 3-14.2 MG TABS, Take 1 tablet by mouth daily at 6 (six) AM. (Patient not taking: Reported on 01/21/2023), Disp: 28 tablet, Rfl:  0   metroNIDAZOLE  (METROGEL ) 0.75 % vaginal gel, Place 1 Applicatorful vaginally at bedtime. Apply one applicatorful to vagina at bedtime for 10 days, then twice a week for 6 months. (Patient not taking: Reported on 01/21/2023), Disp: 70 g, Rfl: 5  Observations/Objective: Patient is well-developed, well-nourished in no acute distress.  Resting comfortably at home.  Head is normocephalic, atraumatic.  No labored breathing.  Speech is clear and coherent with logical content.  Patient is alert and oriented at baseline.    Assessment and Plan: 1. Exudative tonsillitis (Primary) - amoxicillin  (AMOXIL ) 500 MG capsule; Take 1 capsule (500 mg total) by mouth 2 (two) times daily for 10 days.  Dispense: 20 capsule; Refill: 0  - Suspect exudative tonsillitis - Amoxicillin  prescribed - Tylenol  and Ibuprofen  alternating every 4 hours - Salt water gargles - Chloraseptic spray - Liquid and soft food diet - Push fluids - New toothbrush in 3 days - Seek in person evaluation if not improving or if symptoms worsen   Follow Up Instructions: I discussed the assessment and treatment plan with the patient. The patient was provided an opportunity to ask questions and all were answered. The patient agreed with the plan and demonstrated an understanding of the instructions.  A copy of instructions were sent to the patient via MyChart unless otherwise noted below.    The patient was advised to call back or seek an in-person evaluation if the symptoms worsen or if the condition fails to improve as anticipated.    Delon [name], PA-C

## 2023-12-16 NOTE — Patient Instructions (Signed)
 Kristen Pittman, thank you for joining Delon Kristen Dickinson, PA-C for today's virtual visit.  While this provider is not your primary care provider (PCP), if your PCP is located in our provider database this encounter information will be shared with them immediately following your visit.   A Kristen Pittman MyChart account gives you access to today's visit and all your visits, tests, and labs performed at [facility]  click here if you don't have a [facility] MyChart account or go to [email or web address]  Consent: (Patient) Kristen Pittman provided verbal consent for this virtual visit at the beginning of the encounter.  Current Medications:  Current Outpatient Medications:    amoxicillin  (AMOXIL ) 500 MG capsule, Take 1 capsule (500 mg total) by mouth 2 (two) times daily for 10 days., Disp: 20 capsule, Rfl: 0   Drospirenone-Estetrol (NEXTSTELLIS ) 3-14.2 MG TABS, Take 1 tablet by mouth daily at 6 (six) AM. (Patient not taking: Reported on 01/21/2023), Disp: 28 tablet, Rfl: 0   metroNIDAZOLE  (METROGEL ) 0.75 % vaginal gel, Place 1 Applicatorful vaginally at bedtime. Apply one applicatorful to vagina at bedtime for 10 days, then twice a week for 6 months. (Patient not taking: Reported on 01/21/2023), Disp: 70 g, Rfl: 5   Medications ordered in this encounter:  Meds ordered this encounter  Medications   amoxicillin  (AMOXIL ) 500 MG capsule    Sig: Take 1 capsule (500 mg total) by mouth 2 (two) times daily for 10 days.    Dispense:  20 capsule    Refill:  0    Supervising Provider:   BLAISE ALEENE KIDD [[id number]]     *If you need refills on other medications prior to your next appointment, please contact your pharmacy*  Follow-Up: Call back or seek an in-person evaluation if the symptoms worsen or if the condition fails to improve as anticipated.  [facility] [phone]  Other Instructions Tonsillitis  Tonsillitis is an infection of the throat.  Tonsils are tissues in the back of your throat. This infection causes the tonsils to become red, tender, and swollen. What are the causes? Tonsillitis is caused by germs (bacteria or a virus). This condition can also occur when pieces of food and bacteria build up around the tonsils. Tonsillitis that is caused by germs can spread from person to person. What are the signs or symptoms? A sore throat. Trouble swallowing. White patches on the tonsils. Swollen tonsils. Fever. Headache. Tiredness. Not feeling hungry. Snoring during sleep when you did not snore before. Foul-smelling, yellowish-white pieces of material that you cough up or spit out. These can cause bad breath. How is this treated? Medicines. These can be given to treat pain, swelling, or fever. They can also be given to kill bacteria. Surgery to take out the tonsils. This is done if you have very bad infections that do not go away. Follow these instructions at home: Medicines Take over-the-counter and prescription medicines only as told by your doctor. If you were prescribed an antibiotic medicine, take it as told by your doctor. Do not stop taking the antibiotic even if you start to feel better. Eating and drinking Drink enough fluid to keep your pee (urine) pale yellow. While your throat is sore, eat soft or liquid foods, such as: Soup. Sherbet. Soft, warm cereals, such as oatmeal or hot wheat cereal. Drink warm fluids. Eat frozen ice pops. General instructions Rest as much as you can, and get plenty of sleep. Rinse your mouth often with salt water. To make salt  water, dissolve -1 tsp (3-6 g) of salt in 1 cup (237 mL) of warm water. Do not swallow the salt water. Wash your hands often with soap and water for at least 20 seconds. If there is no soap and water, use hand sanitizer. Do not share cups, bottles, or other utensils until your symptoms are gone. Do not smoke or use any products that contain nicotine  or  tobacco. If you need help quitting, ask your doctor. Keep all follow-up visits. Contact a doctor if: You have large, tender lumps in your neck that are new. You have a fever that does not go away after 2-3 days. You have a rash. You cough up green, yellow-brown, or bloody fluid. You cannot swallow liquids or food for 24 hours. Only one of your tonsils is swollen. Get help right away if: You have any new symptoms such as: Vomiting. Very bad headache. Stiff neck. Chest pain. Trouble breathing or swallowing. You have very bad throat pain, and you also have drooling or voice changes. You have very bad pain that is not helped by medicine. You cannot fully open your mouth. You have redness, swelling, or very bad pain anywhere in your neck. Summary Tonsillitis is an infection of the throat. It causes your tonsils to be red, tender, and swollen. While your throat is sore, eat soft or liquid foods. Rinse your mouth often with salt water. Do not share cups, bottles, or other utensils until your symptoms are gone. This information is not intended to replace advice given to you by your health care provider. Make sure you discuss any questions you have with your health care provider. Document Revised: 08/27/2020 Document Reviewed: 08/28/2020 Elsevier Patient Education  2024 Elsevier Inc.   If you have been instructed to have an in-person evaluation today at a local Urgent Care facility, please use the link below. It will take you to a list of all of our available [facility], including address, phone number and hours of operation. Please do not delay care.  [facility]  If you or a family member do not have a primary care provider, use the link below to schedule a visit and establish care. When you choose a [facility] primary care physician or advanced practice provider, you gain a long-term partner in health. Find a Primary Care Provider  Learn more about [facility]'s in-office and virtual care options: [facility] - Get Care Now

## 2024-03-07 ENCOUNTER — Ambulatory Visit

## 2024-03-13 ENCOUNTER — Ambulatory Visit

## 2024-03-25 ENCOUNTER — Telehealth: Admitting: Physician Assistant

## 2024-03-25 DIAGNOSIS — G43809 Other migraine, not intractable, without status migrainosus: Secondary | ICD-10-CM | POA: Diagnosis not present

## 2024-03-25 DIAGNOSIS — R0789 Other chest pain: Secondary | ICD-10-CM | POA: Diagnosis not present

## 2024-03-25 DIAGNOSIS — N926 Irregular menstruation, unspecified: Secondary | ICD-10-CM | POA: Diagnosis not present

## 2024-03-25 NOTE — Patient Instructions (Signed)
 Kristen Pittman, thank you for joining [employer], PA-C for today's virtual visit.  While this provider is not your primary care provider (PCP), if your PCP is located in our provider database this encounter information will be shared with them immediately following your visit.   A [facility] MyChart account gives you access to today's visit and all your visits, tests, and labs performed at [facility]  click here if you don't have a [facility] MyChart account or go to [email or web address]  Consent: (Patient) Kristen Pittman provided verbal consent for this virtual visit at the beginning of the encounter.  Current Medications:  Current Outpatient Medications:    Drospirenone-Estetrol (NEXTSTELLIS ) 3-14.2 MG TABS, Take 1 tablet by mouth daily at 6 (six) AM. (Patient not taking: Reported on 01/21/2023), Disp: 28 tablet, Rfl: 0   metroNIDAZOLE  (METROGEL ) 0.75 % vaginal gel, Place 1 Applicatorful vaginally at bedtime. Apply one applicatorful to vagina at bedtime for 10 days, then twice a week for 6 months. (Patient not taking: Reported on 01/21/2023), Disp: 70 g, Rfl: 5   Medications ordered in this encounter:  No orders of the defined types were placed in this encounter.    *If you need refills on other medications prior to your next appointment, please contact your pharmacy*  Follow-Up: Call back or seek an in-person evaluation if the symptoms worsen or if the condition fails to improve as anticipated.  [facility] [phone]  Other Instructions Migraine Headache A migraine headache is an intense pulsing or throbbing pain on one or both sides of the head. Migraine headaches may also cause other symptoms, such as nausea, vomiting, and sensitivity to light and noise. A migraine headache can last from 4 hours to 3 days. Talk with your health care provider about what things may bring on (trigger) your migraine headaches. What are the  causes? The exact cause is not known. However, a migraine may be caused when nerves in the brain get irritated and release chemicals that cause blood vessels to become inflamed. This inflammation causes pain. Migraines may be triggered or caused by: Smoking. Medicines, such as: Nitroglycerin, which is used to treat chest pain. Birth control pills. Estrogen. Certain blood pressure medicines. Foods or drinks that contain nitrates, glutamate, aspartame, MSG, or tyramine. Certain foods or drinks, such as aged cheeses, chocolate, alcohol, or caffeine. Doing physical activity that is very hard. Other triggers may include: Menstruation. Pregnancy. Hunger. Stress. Getting too much or too little sleep. Weather changes. Tiredness (fatigue). What increases the risk? The following factors may make you more likely to have migraine headaches: Being between the ages of 36-31 years old. Being female. Having a family history of migraine headaches. Being Caucasian. Having a mental health condition, such as depression or anxiety. Being obese. What are the signs or symptoms? The main symptom of this condition is pulsing or throbbing pain. This pain may: Happen in any area of the head, such as on one or both sides. Make it hard to do daily activities. Get worse with physical activity. Get worse around bright lights, loud noises, or smells. Other symptoms may include: Nausea. Vomiting. Dizziness. Before a migraine headache starts, you may get warning signs (an aura). An aura may include: Seeing flashing lights or having blind spots. Seeing bright spots, halos, or zigzag lines. Having tunnel vision or blurred vision. Having numbness or a tingling feeling. Having trouble talking. Having muscle weakness. After a migraine ends, you may have symptoms. These may include: Feeling tired.  Trouble concentrating. How is this diagnosed? A migraine headache can be diagnosed based on: Your symptoms. A  physical exam. Tests, such as: A CT scan or an MRI of the head. These tests can help rule out other causes of headaches. Taking fluid from the spine (lumbar puncture) to examine it (cerebrospinal fluid analysis, or CSF analysis). How is this treated? This condition may be treated with medicines that: Relieve pain and nausea. Prevent migraines. Treatment may also include: Acupuncture. Lifestyle changes like avoiding foods that trigger migraine headaches. Learning ways to control your body (biofeedback). Talk therapy to help you know and deal with negative thoughts (cognitive behavioral therapy). Follow these instructions at home: Medicines Take over-the-counter and prescription medicines only as told by your provider. Ask your provider if the medicine prescribed to you: Requires you to avoid driving or using machinery. Can cause constipation. You may need to take these actions to prevent or treat constipation: Drink enough fluid to keep your pee (urine) pale yellow. Take over-the-counter or prescription medicines. Eat foods that are high in fiber, such as beans, whole grains, and fresh fruits and vegetables. Limit foods that are high in fat and processed sugars, such as fried or sweet foods. Lifestyle  Do not drink alcohol. Do not use any products that contain nicotine  or tobacco. These products include cigarettes, chewing tobacco, and vaping devices, such as e-cigarettes. If you need help quitting, ask your provider. Get 7-9 hours of sleep each night, or the amount recommended by your provider. Find ways to manage stress, such as meditation, deep breathing, or yoga. Try to exercise regularly. This can help lessen how bad and how often your migraines occur. General instructions Keep a journal to find out what triggers your migraines, so you can avoid those things. For example, write down: What you eat and drink. How much sleep you get. Any change to your diet or medicines. If you  have a migraine headache: Avoid things that make your symptoms worse, such as bright lights. Lie down in a dark, quiet room. Do not drive or use machinery. Ask your provider what activities are safe for you while you have symptoms. Keep all follow-up visits. Your provider will monitor your symptoms and recommend any further treatment. Where to find more information Coalition for Headache and Migraine Patients (CHAMP): headachemigraine.org American Migraine Foundation: americanmigrainefoundation.org National Headache Foundation: headaches.org Contact a health care provider if: You have symptoms that are different or worse than your usual migraine headache symptoms. You have more than 15 days of headaches in one month. Get help right away if: Your migraine headache becomes severe or lasts more than 72 hours. You have a fever or stiff neck. You have vision loss. Your muscles feel weak or like you cannot control them. You lose your balance often or have trouble walking. You faint. You have a seizure. This information is not intended to replace advice given to you by your health care provider. Make sure you discuss any questions you have with your health care provider. Document Revised: 11/30/2021 Document Reviewed: 11/30/2021 Elsevier Patient Education  2024 Elsevier Inc.   If you have been instructed to have an in-person evaluation today at a local Urgent Care facility, please use the link below. It will take you to a list of all of our available [facility], including address, phone number and hours of operation. Please do not delay care.  [facility]  If you or a family member do not have a primary  care provider, use the link below to schedule a visit and establish care. When you choose a [facility] primary care physician or advanced practice provider, you gain a long-term partner in health. Find a Primary Care Provider  Learn more about [facility]'s  in-office and virtual care options: [facility] - Get Care Now

## 2024-03-25 NOTE — Progress Notes (Signed)
 Virtual Visit Consent   Kristen Pittman, you are scheduled for a virtual visit with a [facility] provider today. Just as with appointments in the office, your consent must be obtained to participate. Your consent will be active for this visit and any virtual visit you may have with one of our providers in the next 365 days. If you have a MyChart account, a copy of this consent can be sent to you electronically.  As this is a virtual visit, video technology does not allow for your provider to perform a traditional examination. This may limit your provider's ability to fully assess your condition. If your provider identifies any concerns that need to be evaluated in person or the need to arrange testing (such as labs, EKG, etc.), we will make arrangements to do so. Although advances in technology are sophisticated, we cannot ensure that it will always work on either your end or our end. If the connection with a video visit is poor, the visit may have to be switched to a telephone visit. With either a video or telephone visit, we are not always able to ensure that we have a secure connection.  By engaging in this virtual visit, you consent to the provision of healthcare and authorize for your insurance to be billed (if applicable) for the services provided during this visit. Depending on your insurance coverage, you may receive a charge related to this service.  I need to obtain your verbal consent now. Are you willing to proceed with your visit today? Kristen Pittman has provided verbal consent on 03/25/2024 for a virtual visit (video or telephone). Kristen GORMAN Sage, PA-C  Date: 03/25/2024 4:20 PM   Virtual Visit via Video Note   I, Kristen Pittman, connected with  Kristen Pittman  ([id number], [birth year]) on 03/25/24 at  4:00 PM EST by a video-enabled telemedicine application and verified that I am speaking with the correct person using two identifiers.  Location: Patient: Virtual  Visit Location Patient: Home Provider: Virtual Visit Location Provider: Home Office   I discussed the limitations of evaluation and management by telemedicine and the availability of in person appointments. The patient expressed understanding and agreed to proceed.    History of Present Illness: Kristen Pittman is a 23 y.o. who identifies as a female who was assigned female at birth, and is being seen today  with headaches and chest pain.  She reports severe migraines three to four times per week for the past couple of weeks, associated with nausea and photophobia, which are relieved by sleep. She is not having a headache today.  She describes intermittent right-sided chest pain near the right breast for the past few months. A chest x-ray at urgent care was normal. She uses Tylenol  and rest with variable relief and notes that some episodes are severe. She is not having chest pain today.  She reports a late menstrual period. A home pregnancy test was negative. She is not currently using birth control, though she used it in the past for about two years.  Problems:  Patient Active Problem List   Diagnosis Date Noted   Amenorrhea 01/10/2023   UTI symptoms 01/10/2023   Anxiety and depression 07/24/2020   History of depression 05/10/2019   History of COVID-19 05/10/2019    Allergies:  Allergies  Allergen Reactions   Lemon Oil Swelling    Throat itching and entire body itchy   Vaccinium Angustifolium Swelling and Rash    blueberries   Medications:  Current Outpatient  Medications:    Drospirenone-Estetrol (NEXTSTELLIS ) 3-14.2 MG TABS, Take 1 tablet by mouth daily at 6 (six) AM. (Patient not taking: Reported on 01/21/2023), Disp: 28 tablet, Rfl: 0   metroNIDAZOLE  (METROGEL ) 0.75 % vaginal gel, Place 1 Applicatorful vaginally at bedtime. Apply one applicatorful to vagina at bedtime for 10 days, then twice a week for 6 months. (Patient not taking: Reported on 01/21/2023), Disp: 70 g, Rfl:  5  Observations/Objective: Patient is well-developed, well-nourished in no acute distress.  Resting comfortably  at home.  Head is normocephalic, atraumatic.  No labored breathing.  Speech is clear and coherent with logical content.  Patient is alert and oriented at baseline.    Assessment and Plan: 1. Other migraine without status migrainosus, not intractable (Primary)  2. Atypical chest pain  3. Late period   Assessment and Plan Migraine with associated nausea and photophobia Recurrent migraines 3-4 times weekly with nausea and photophobia. No current migraine. - Advised rest and hydration. - Recommended evaluation by primary care provider for further assessment and management. Patient has appt with PCP on 12/9  Right-sided chest pain Intermittent right-sided chest pain for months, managed with Tylenol  and rest. Previous chest x-ray unremarkable. - Recommended evaluation by primary care provider for further assessment and management.  Late menstrual period with negative pregnancy test Late menstrual period with negative pregnancy test. On birth control for two years. - Recommended evaluation by primary care provider for further assessment and management.   Follow Up Instructions: I discussed the assessment and treatment plan with the patient. The patient was provided an opportunity to ask questions and all were answered. The patient agreed with the plan and demonstrated an understanding of the instructions.  A copy of instructions were sent to the patient via MyChart unless otherwise noted below.     The patient was advised to call back or seek an in-person evaluation if the symptoms worsen or if the condition fails to improve as anticipated.    Kristen RAMAN Mayers, PA-C

## 2024-03-29 ENCOUNTER — Telehealth

## 2024-03-31 ENCOUNTER — Telehealth: Admitting: Nurse Practitioner

## 2024-03-31 DIAGNOSIS — M545 Low back pain, unspecified: Secondary | ICD-10-CM

## 2024-03-31 MED ORDER — NAPROXEN 500 MG PO TABS: 500.0000 mg | ORAL_TABLET | Freq: Two times a day (BID) | ORAL | 0 refills | Status: AC

## 2024-03-31 MED ORDER — CYCLOBENZAPRINE HCL 10 MG PO TABS: 10.0000 mg | ORAL_TABLET | Freq: Three times a day (TID) | ORAL | 0 refills | Status: AC | PRN

## 2024-03-31 NOTE — Progress Notes (Signed)

## 2024-04-16 ENCOUNTER — Telehealth: Admitting: Nurse Practitioner

## 2024-04-16 DIAGNOSIS — R102 Pelvic and perineal pain unspecified side: Secondary | ICD-10-CM

## 2024-04-16 NOTE — Progress Notes (Signed)
 Because of your symptoms of pain ,  I feel your condition warrants further evaluation and I recommend that you be seen in a face to face visit with your gynecologist or at one of our [facility].   NOTE: There will be NO CHARGE for this eVisit   If you are having a true medical emergency please call 911.    *[facility] for [facility]             [address], [place], [place] [address] [phone] (*Take patients with no insurance)  *[facility] for [facility] at [facility] [address] [place], [place],  [place]  [address] [phone] (*Take patients with no insurance)  [facility] for [employer] at [facility]                                                             [address], [place], [place], [address] [phone]  Center for [facility] at [facility] [address], [address], [place], [place], [address] [phone]  Center for [facility] at [facility] [address], [address], [place], [place], [address] [phone]  Center for [facility]                                 [address] [place], [place], [place], [address] [phone]  Center for [facility] at [facility]                                    [address] [place], [place], [address] [phone]  Center for [facility] at [facility] [address], [place], [place], [address]                              [facility] [address], [address], [place], [place], [address] [phone]  Your MyChart E-visit questionnaire answers were reviewed by a board certified advanced clinical practitioner to complete your personal care plan based on your specific symptoms.  Thank you for using e-Visits.

## 2024-04-30 ENCOUNTER — Telehealth: Admitting: Emergency Medicine

## 2024-04-30 DIAGNOSIS — R062 Wheezing: Secondary | ICD-10-CM | POA: Diagnosis not present

## 2024-04-30 DIAGNOSIS — J111 Influenza due to unidentified influenza virus with other respiratory manifestations: Secondary | ICD-10-CM | POA: Diagnosis not present

## 2024-04-30 MED ORDER — PREDNISONE 20 MG PO TABS: 20.0000 mg | ORAL_TABLET | Freq: Every day | ORAL | 0 refills | Status: AC

## 2024-04-30 NOTE — Patient Instructions (Signed)
" °  Kristen Pittman, thank you for joining Jon CHRISTELLA Belt, NP for today's virtual visit.  While this provider is not your primary care provider (PCP), if your PCP is located in our provider database this encounter information will be shared with them immediately following your visit.   A [facility] MyChart account gives you access to today's visit and all your visits, tests, and labs performed at [facility]  click here if you don't have a [facility] MyChart account or go to [email or web address]  Consent: (Patient) Kristen Pittman provided verbal consent for this virtual visit at the beginning of the encounter.  Current Medications:  Current Outpatient Medications:    predniSONE  (DELTASONE ) 20 MG tablet, Take 1 tablet (20 mg total) by mouth daily with breakfast for 5 days., Disp: 5 tablet, Rfl: 0   cyclobenzaprine  (FLEXERIL ) 10 MG tablet, Take 1 tablet (10 mg total) by mouth 3 (three) times daily as needed for muscle spasms., Disp: 30 tablet, Rfl: 0   Drospirenone-Estetrol (NEXTSTELLIS ) 3-14.2 MG TABS, Take 1 tablet by mouth daily at 6 (six) AM. (Patient not taking: Reported on 01/21/2023), Disp: 28 tablet, Rfl: 0   metroNIDAZOLE  (METROGEL ) 0.75 % vaginal gel, Place 1 Applicatorful vaginally at bedtime. Apply one applicatorful to vagina at bedtime for 10 days, then twice a week for 6 months. (Patient not taking: Reported on 01/21/2023), Disp: 70 g, Rfl: 5   naproxen  (NAPROSYN ) 500 MG tablet, Take 1 tablet (500 mg total) by mouth 2 (two) times daily with a meal., Disp: 30 tablet, Rfl: 0   Medications ordered in this encounter:  Meds ordered this encounter  Medications   predniSONE  (DELTASONE ) 20 MG tablet    Sig: Take 1 tablet (20 mg total) by mouth daily with breakfast for 5 days.    Dispense:  5 tablet    Refill:  0     *If you need refills on other medications prior to your next appointment, please contact your pharmacy*  Follow-Up: Call back or seek an  in-person evaluation if the symptoms worsen or if the condition fails to improve as anticipated.  [facility] [phone]  Other Instructions  Continue taking the tamiflu (oseltamivir) until it is gone. You should feel better in a couple of days. Continue using your inhaler if needed for wheezing every 4-6 hours. The prednisone  prescribed today should help your breathing   If you have been instructed to have an in-person evaluation today at a local Urgent Care facility, please use the link below. It will take you to a list of all of our available [facility], including address, phone number and hours of operation. Please do not delay care.  [facility]  If you or a family member do not have a primary care provider, use the link below to schedule a visit and establish care. When you choose a [facility] primary care physician or advanced practice provider, you gain a long-term partner in health. Find a Primary Care Provider  Learn more about [facility]'s in-office and virtual care options: [facility] - Get Care Now  "

## 2024-04-30 NOTE — Progress Notes (Signed)
 " Virtual Visit Consent   Kristen Pittman, you are scheduled for a virtual visit with a [facility] provider today. Just as with appointments in the office, your consent must be obtained to participate. Your consent will be active for this visit and any virtual visit you may have with one of our providers in the next 365 days. If you have a MyChart account, a copy of this consent can be sent to you electronically.  As this is a virtual visit, video technology does not allow for your provider to perform a traditional examination. This may limit your provider's ability to fully assess your condition. If your provider identifies any concerns that need to be evaluated in person or the need to arrange testing (such as labs, EKG, etc.), we will make arrangements to do so. Although advances in technology are sophisticated, we cannot ensure that it will always work on either your end or our end. If the connection with a video visit is poor, the visit may have to be switched to a telephone visit. With either a video or telephone visit, we are not always able to ensure that we have a secure connection.  By engaging in this virtual visit, you consent to the provision of healthcare and authorize for your insurance to be billed (if applicable) for the services provided during this visit. Depending on your insurance coverage, you may receive a charge related to this service.  I need to obtain your verbal consent now. Are you willing to proceed with your visit today? Favor Pittman has provided verbal consent on 04/30/2024 for a virtual visit (video or telephone). Jon CHRISTELLA Belt, NP  Date: 04/30/2024 11:24 AM   Virtual Visit via Video Note   I, Jon CHRISTELLA Belt, connected with  Kristen Pittman  ([id number], [birth year]) on 04/30/2024 at 11:15 AM EST by a video-enabled telemedicine application and verified that I am speaking with the correct person using two identifiers.  Location: Patient: Virtual  Visit Location Patient: Home Provider: Virtual Visit Location Provider: Home Office   I discussed the limitations of evaluation and management by telemedicine and the availability of in person appointments. The patient expressed understanding and agreed to proceed.    History of Present Illness: Kristen Pittman is a 24 y.o. who identifies as a female who was assigned female at birth, and is being seen today for influenza, not getting better  Flu sx for 5 days. Body aches, fever, ears hurting, sore throat, congestion, cough (cough has improved)  Asthma - wheezing some, using albuterol - using it often to manage symptoms.   Taking promethatzine DM, benzonatate, tamiflu x 4 days from [facility], also taking otc cold and flu.   Feels she is not getting better and needs to go back to work  HPI: HPI  Problems:  Patient Active Problem List   Diagnosis Date Noted   Amenorrhea 01/10/2023   UTI symptoms 01/10/2023   Anxiety and depression 07/24/2020   History of depression 05/10/2019   History of COVID-19 05/10/2019    Allergies: Allergies[1] Medications: Current Medications[2]  Observations/Objective: Patient is well-developed, well-nourished in no acute distress.  Resting comfortably  at home.  Head is normocephalic, atraumatic.  No labored breathing. Speaking in complete sentences Speech is clear and coherent with logical content.  Patient is alert and oriented at baseline.    Assessment and Plan: 1. Influenza (Primary)  2. Wheezing - predniSONE  (DELTASONE ) 20 MG tablet; Take 1 tablet (20 mg total) by mouth daily with breakfast for  5 days.  Dispense: 5 tablet; Refill: 0  Explained usual course of influenza. I expect she will feel better in a couple of days.   Since she is wheezing some, I rx prednisone . Given note for work.   Follow Up Instructions: I discussed the assessment and treatment plan with the patient. The patient was provided an opportunity to ask questions  and all were answered. The patient agreed with the plan and demonstrated an understanding of the instructions.  A copy of instructions were sent to the patient via MyChart unless otherwise noted below.   The patient was advised to call back or seek an in-person evaluation if the symptoms worsen or if the condition fails to improve as anticipated.    Jon CHRISTELLA Belt, NP     [1]  Allergies Allergen Reactions   Lemon Oil Swelling    Throat itching and entire body itchy   Vaccinium Angustifolium Swelling and Rash    blueberries  [2]  Current Outpatient Medications:    predniSONE  (DELTASONE ) 20 MG tablet, Take 1 tablet (20 mg total) by mouth daily with breakfast for 5 days., Disp: 5 tablet, Rfl: 0   cyclobenzaprine  (FLEXERIL ) 10 MG tablet, Take 1 tablet (10 mg total) by mouth 3 (three) times daily as needed for muscle spasms., Disp: 30 tablet, Rfl: 0   Drospirenone-Estetrol (NEXTSTELLIS ) 3-14.2 MG TABS, Take 1 tablet by mouth daily at 6 (six) AM. (Patient not taking: Reported on 01/21/2023), Disp: 28 tablet, Rfl: 0   metroNIDAZOLE  (METROGEL ) 0.75 % vaginal gel, Place 1 Applicatorful vaginally at bedtime. Apply one applicatorful to vagina at bedtime for 10 days, then twice a week for 6 months. (Patient not taking: Reported on 01/21/2023), Disp: 70 g, Rfl: 5   naproxen  (NAPROSYN ) 500 MG tablet, Take 1 tablet (500 mg total) by mouth 2 (two) times daily with a meal., Disp: 30 tablet, Rfl: 0  "
